# Patient Record
Sex: Female | Born: 1970 | Race: Black or African American | Hispanic: No | Marital: Single | Smoking: Never smoker
Health system: Southern US, Community
[De-identification: ages and names within clinical notes are randomized; demographics above are authoritative.]

## PROBLEM LIST (undated history)

## (undated) ENCOUNTER — Ambulatory Visit: Source: Home / Self Care | Attending: Family Medicine | Admitting: Family Medicine

## (undated) DIAGNOSIS — D649 Anemia, unspecified: Secondary | ICD-10-CM

## (undated) DIAGNOSIS — N920 Excessive and frequent menstruation with regular cycle: Secondary | ICD-10-CM

---

## 1999-09-27 HISTORY — PX: SALPINGECTOMY: SHX328

## 2001-09-26 HISTORY — PX: TUBAL LIGATION: SHX77

## 2002-01-09 ENCOUNTER — Ambulatory Visit (HOSPITAL_COMMUNITY): Admission: RE | Admit: 2002-01-09 | Discharge: 2002-01-09 | Payer: Self-pay | Admitting: *Deleted

## 2002-02-20 ENCOUNTER — Other Ambulatory Visit: Admission: RE | Admit: 2002-02-20 | Discharge: 2002-03-08 | Payer: Self-pay | Admitting: Obstetrics

## 2002-04-04 ENCOUNTER — Inpatient Hospital Stay (HOSPITAL_COMMUNITY): Admission: AD | Admit: 2002-04-04 | Discharge: 2002-04-04 | Payer: Self-pay | Admitting: *Deleted

## 2002-04-17 ENCOUNTER — Encounter (INDEPENDENT_AMBULATORY_CARE_PROVIDER_SITE_OTHER): Payer: Self-pay

## 2002-04-17 ENCOUNTER — Inpatient Hospital Stay (HOSPITAL_COMMUNITY): Admission: AD | Admit: 2002-04-17 | Discharge: 2002-04-19 | Payer: Self-pay | Admitting: Obstetrics and Gynecology

## 2006-03-02 ENCOUNTER — Emergency Department (HOSPITAL_COMMUNITY): Admission: EM | Admit: 2006-03-02 | Discharge: 2006-03-02 | Payer: Self-pay | Admitting: Emergency Medicine

## 2006-04-29 ENCOUNTER — Emergency Department (HOSPITAL_COMMUNITY): Admission: EM | Admit: 2006-04-29 | Discharge: 2006-04-29 | Payer: Self-pay | Admitting: Emergency Medicine

## 2006-10-30 ENCOUNTER — Emergency Department (HOSPITAL_COMMUNITY): Admission: EM | Admit: 2006-10-30 | Discharge: 2006-10-30 | Payer: Self-pay | Admitting: Emergency Medicine

## 2007-06-28 ENCOUNTER — Emergency Department (HOSPITAL_COMMUNITY): Admission: EM | Admit: 2007-06-28 | Discharge: 2007-06-29 | Payer: Self-pay | Admitting: *Deleted

## 2007-08-28 ENCOUNTER — Emergency Department (HOSPITAL_COMMUNITY): Admission: EM | Admit: 2007-08-28 | Discharge: 2007-08-28 | Payer: Self-pay | Admitting: Emergency Medicine

## 2008-02-03 ENCOUNTER — Emergency Department (HOSPITAL_COMMUNITY): Admission: EM | Admit: 2008-02-03 | Discharge: 2008-02-04 | Payer: Self-pay | Admitting: Emergency Medicine

## 2008-03-12 ENCOUNTER — Emergency Department (HOSPITAL_COMMUNITY): Admission: EM | Admit: 2008-03-12 | Discharge: 2008-03-12 | Payer: Self-pay | Admitting: Emergency Medicine

## 2008-07-23 ENCOUNTER — Emergency Department (HOSPITAL_COMMUNITY): Admission: EM | Admit: 2008-07-23 | Discharge: 2008-07-23 | Payer: Self-pay | Admitting: Emergency Medicine

## 2008-07-24 ENCOUNTER — Emergency Department (HOSPITAL_COMMUNITY): Admission: EM | Admit: 2008-07-24 | Discharge: 2008-07-24 | Payer: Self-pay | Admitting: Emergency Medicine

## 2009-09-25 ENCOUNTER — Emergency Department (HOSPITAL_COMMUNITY): Admission: EM | Admit: 2009-09-25 | Discharge: 2009-09-26 | Payer: Self-pay | Admitting: Emergency Medicine

## 2011-02-11 NOTE — Op Note (Signed)
[facility]  PatientALEAYA, LATONA Visit Number: [id number] MRN: [id number]          Service Type: OBS Location: 910A [id number] Attending Physician:  Amada Kingfisher. Dictated by:   Shearon Balo, M.D. Proc. Date: 04/18/02 Admit Date:  04/17/2002 Discharge Date: 04/19/2002                             Operative Report  PREOPERATIVE DIAGNOSES:       A 40 year old G6, P6 status post spontaneous vaginal delivery with undesired fertility.  POSTOPERATIVE DIAGNOSES:      A 40 year old G6, P6 status post spontaneous vaginal delivery with undesired fertility.  PROCEDURE:                    Bilateral tubal ligation with right distal salpingectomy.  SURGEON:                      Shearon Balo, M.D.  ANESTHESIA:                   General endotracheal.  COMPLICATIONS:                None.  SPECIMENS:                    Left segment of fallopian tube and right distal tube.  ESTIMATED BLOOD LOSS:         20 cc.  DISPOSITION:                  Recovery room, stable.  INDICATIONS FOR PROCEDURE:    The patient is a 40 year old G6, P6 status post spontaneous vaginal delivery who has undesired fertility.  The 30 day medicaid papers were obtained and found to be valid.  She had been counseled on the risks of bleeding, infection, injury to internal organs.  In addition, she was counseled on the risk of failure of 1 per 200 and increased risk of ectopic gestation.  In addition, she was counseled on the permanence of this procedure and alternative forms of contraception.  The patient states that she had a previous right salpingectomy from an ectopic pregnancy.  This procedure was done in [place], [place] and no operative note is immediately available.  PROCEDURE:                    The patient is taken to the operating room.  She is placed under general endotracheal anesthesia.  She is prepped and draped in a sterile fashion.  Incision made at the umbilicus  with a scalpel and carried down to underlying fascia with the Mayo scissors.  The fascia was entered sharply.  The patients left fallopian tube was identified and followed to the fimbriated end.  The tube was grasped approximately 3 cm from the cornual region and ligated with a tie of 0 plain gut.  A 1 cm segment of tube was excised.  The patients right fallopian tube was attempted to be found.  There were some filmy adhesions on that side and after lysis they we were able to visualize a portion of the patients right tube.  Although there were adhesions here, there was no definite partial salpingectomy.  Decision was made to perform a distal salpingectomy.  The fimbriae were densely adhesed to the ovary and dissected to be freed off the surface of the  ovary.  This was hemostatic.  A double loop of 0 plain gut was tied around the mid portion of the tube.  Approximately 2 cm segment of tube was excised with the fimbriae intact.  The tube was inspected and noted to be hemostatic.  The patients left tube was again inspected and noted to be hemostatic.  The fascia was then closed with a running stitch of 0 Vicryl.  The subcutaneous tissues were irrigated briskly and then injected with Marcaine for anesthesia.  The skin edges were reapproximated with a subcuticular stitch of 4-0 Monocryl.  The patient tolerated procedure well and returned to the recovery room in stable condition. Dictated by:   Shearon Balo, M.D. Attending Physician:  Amada Kingfisher. DD:  04/18/02 TD:  04/22/02 Job: [id number]

## 2011-07-07 LAB — URINE CULTURE: Culture: NO GROWTH

## 2011-07-07 LAB — URINE MICROSCOPIC-ADD ON

## 2011-07-07 LAB — URINALYSIS, ROUTINE W REFLEX MICROSCOPIC
Bilirubin Urine: NEGATIVE
Nitrite: NEGATIVE
Specific Gravity, Urine: 1.025
Urobilinogen, UA: 1
pH: 6.5

## 2011-07-07 LAB — PREGNANCY, URINE: Preg Test, Ur: NEGATIVE

## 2012-01-27 ENCOUNTER — Emergency Department (HOSPITAL_COMMUNITY): Payer: Self-pay

## 2012-01-27 ENCOUNTER — Emergency Department (HOSPITAL_COMMUNITY)
Admission: EM | Admit: 2012-01-27 | Discharge: 2012-01-27 | Disposition: A | Discharge status: Home / Self Care | Payer: Self-pay | Attending: Emergency Medicine | Admitting: Emergency Medicine

## 2012-01-27 ENCOUNTER — Encounter (HOSPITAL_COMMUNITY): Payer: Self-pay | Admitting: Anesthesiology

## 2012-01-27 ENCOUNTER — Emergency Department (HOSPITAL_COMMUNITY): Payer: Self-pay | Admitting: Anesthesiology

## 2012-01-27 ENCOUNTER — Encounter (HOSPITAL_COMMUNITY)
Admission: EM | Disposition: A | Discharge status: Home / Self Care | Payer: Self-pay | Source: Home / Self Care | Attending: Emergency Medicine

## 2012-01-27 ENCOUNTER — Encounter (HOSPITAL_COMMUNITY): Payer: Self-pay | Admitting: Emergency Medicine

## 2012-01-27 DIAGNOSIS — S01501A Unspecified open wound of lip, initial encounter: Secondary | ICD-10-CM | POA: Insufficient documentation

## 2012-01-27 DIAGNOSIS — IMO0002 Reserved for concepts with insufficient information to code with codable children: Secondary | ICD-10-CM | POA: Insufficient documentation

## 2012-01-27 DIAGNOSIS — Z23 Encounter for immunization: Secondary | ICD-10-CM | POA: Insufficient documentation

## 2012-01-27 DIAGNOSIS — S02402A Zygomatic fracture, unspecified, initial encounter for closed fracture: Secondary | ICD-10-CM

## 2012-01-27 DIAGNOSIS — R220 Localized swelling, mass and lump, head: Secondary | ICD-10-CM | POA: Insufficient documentation

## 2012-01-27 DIAGNOSIS — S0083XA Contusion of other part of head, initial encounter: Secondary | ICD-10-CM

## 2012-01-27 DIAGNOSIS — S02401A Maxillary fracture, unspecified, initial encounter for closed fracture: Secondary | ICD-10-CM | POA: Insufficient documentation

## 2012-01-27 DIAGNOSIS — S01511A Laceration without foreign body of lip, initial encounter: Secondary | ICD-10-CM

## 2012-01-27 DIAGNOSIS — R110 Nausea: Secondary | ICD-10-CM | POA: Insufficient documentation

## 2012-01-27 DIAGNOSIS — S0003XA Contusion of scalp, initial encounter: Secondary | ICD-10-CM | POA: Insufficient documentation

## 2012-01-27 DIAGNOSIS — S02400A Malar fracture unspecified, initial encounter for closed fracture: Secondary | ICD-10-CM | POA: Insufficient documentation

## 2012-01-27 DIAGNOSIS — Y92009 Unspecified place in unspecified non-institutional (private) residence as the place of occurrence of the external cause: Secondary | ICD-10-CM | POA: Insufficient documentation

## 2012-01-27 DIAGNOSIS — H1132 Conjunctival hemorrhage, left eye: Secondary | ICD-10-CM

## 2012-01-27 DIAGNOSIS — H1130 Conjunctival hemorrhage, unspecified eye: Secondary | ICD-10-CM | POA: Insufficient documentation

## 2012-01-27 DIAGNOSIS — R6884 Jaw pain: Secondary | ICD-10-CM | POA: Insufficient documentation

## 2012-01-27 HISTORY — PX: ORIF ZYGOMATIC FRACTURE: SHX2134

## 2012-01-27 LAB — POCT I-STAT, CHEM 8
BUN: 6 mg/dL (ref 6–23)
Creatinine, Ser: 0.8 mg/dL (ref 0.50–1.10)
Glucose, Bld: 128 mg/dL — ABNORMAL HIGH (ref 70–99)
Hemoglobin: 11.9 g/dL — ABNORMAL LOW (ref 12.0–15.0)
Potassium: 4.5 mEq/L (ref 3.5–5.1)
Sodium: 139 mEq/L (ref 135–145)

## 2012-01-27 SURGERY — OPEN REDUCTION INTERNAL FIXATION (ORIF) ZYGOMATIC FRACTURE
Anesthesia: General | Site: Face | Laterality: Right | Wound class: Clean Contaminated

## 2012-01-27 MED ORDER — MORPHINE SULFATE 4 MG/ML IJ SOLN: 4.0000 mg | INTRAMUSCULAR | Status: DC | PRN

## 2012-01-27 MED ORDER — ACETAMINOPHEN 10 MG/ML IV SOLN
INTRAVENOUS | Status: DC | PRN
  Administered 2012-01-27: 1000 mg via INTRAVENOUS

## 2012-01-27 MED ORDER — CEFAZOLIN SODIUM-DEXTROSE 2-3 GM-% IV SOLR
2.0000 g | Freq: Once | INTRAVENOUS | Status: AC
Start: 2012-01-27 — End: 2012-01-27
  Administered 2012-01-27: 2 g via INTRAVENOUS
  Filled 2012-01-27: qty 50

## 2012-01-27 MED ORDER — ACETAMINOPHEN 10 MG/ML IV SOLN
INTRAVENOUS | Status: AC
  Filled 2012-01-27: qty 100

## 2012-01-27 MED ORDER — PROMETHAZINE HCL 25 MG/ML IJ SOLN: 6.2500 mg | INTRAMUSCULAR | Status: DC | PRN

## 2012-01-27 MED ORDER — HYDROCODONE-ACETAMINOPHEN 5-325 MG PO TABS
1.0000 | ORAL_TABLET | Freq: Once | ORAL | Status: AC
  Administered 2012-01-27: 1 via ORAL

## 2012-01-27 MED ORDER — SODIUM CHLORIDE 0.9 % IV SOLN: INTRAVENOUS | Status: DC

## 2012-01-27 MED ORDER — LACTATED RINGERS IV SOLN
INTRAVENOUS | Status: DC | PRN
  Administered 2012-01-27: 16:00:00 via INTRAVENOUS

## 2012-01-27 MED ORDER — KETAMINE HCL 10 MG/ML IJ SOLN
INTRAMUSCULAR | Status: DC | PRN
  Administered 2012-01-27: 10 mg via INTRAVENOUS

## 2012-01-27 MED ORDER — SUCCINYLCHOLINE CHLORIDE 20 MG/ML IJ SOLN
INTRAMUSCULAR | Status: DC | PRN
  Administered 2012-01-27: 140 mg via INTRAVENOUS

## 2012-01-27 MED ORDER — HYDROCODONE-ACETAMINOPHEN 5-325 MG PO TABS
2.0000 | ORAL_TABLET | Freq: Once | ORAL | Status: AC
  Administered 2012-01-27: 2 via ORAL
  Filled 2012-01-27: qty 2

## 2012-01-27 MED ORDER — LIDOCAINE-EPINEPHRINE 1 %-1:100000 IJ SOLN
INTRAMUSCULAR | Status: AC
  Filled 2012-01-27: qty 1

## 2012-01-27 MED ORDER — LACTATED RINGERS IV SOLN: INTRAVENOUS | Status: DC

## 2012-01-27 MED ORDER — TETANUS-DIPHTH-ACELL PERTUSSIS 5-2.5-18.5 LF-MCG/0.5 IM SUSP
0.5000 mL | Freq: Once | INTRAMUSCULAR | Status: AC
  Administered 2012-01-27: 0.5 mL via INTRAMUSCULAR
  Filled 2012-01-27: qty 0.5

## 2012-01-27 MED ORDER — DEXAMETHASONE SODIUM PHOSPHATE 4 MG/ML IJ SOLN
INTRAMUSCULAR | Status: DC | PRN
  Administered 2012-01-27: 10 mg via INTRAVENOUS

## 2012-01-27 MED ORDER — ONDANSETRON HCL 4 MG/2ML IJ SOLN
INTRAMUSCULAR | Status: DC | PRN
  Administered 2012-01-27 (×2): 2 mg via INTRAVENOUS

## 2012-01-27 MED ORDER — LIDOCAINE HCL (CARDIAC) 20 MG/ML IV SOLN
INTRAVENOUS | Status: DC | PRN
  Administered 2012-01-27: 20 mg via INTRAVENOUS

## 2012-01-27 MED ORDER — FENTANYL CITRATE 0.05 MG/ML IJ SOLN
INTRAMUSCULAR | Status: DC | PRN
  Administered 2012-01-27: 50 ug via INTRAVENOUS
  Administered 2012-01-27 (×2): 100 ug via INTRAVENOUS

## 2012-01-27 MED ORDER — LIDOCAINE-EPINEPHRINE 1 %-1:100000 IJ SOLN
INTRAMUSCULAR | Status: DC | PRN
  Administered 2012-01-27: 2 mL

## 2012-01-27 MED ORDER — LACTATED RINGERS IV SOLN
INTRAVENOUS | Status: DC
  Administered 2012-01-27: 1000 mL via INTRAVENOUS

## 2012-01-27 MED ORDER — MIDAZOLAM HCL 5 MG/5ML IJ SOLN
INTRAMUSCULAR | Status: DC | PRN
  Administered 2012-01-27: 2 mg via INTRAVENOUS

## 2012-01-27 MED ORDER — CEFAZOLIN SODIUM 1-5 GM-% IV SOLN: 1.0000 g | Freq: Once | INTRAVENOUS | Status: DC

## 2012-01-27 MED ORDER — LIDOCAINE HCL 2 % IJ SOLN
20.0000 mL | Freq: Once | INTRAMUSCULAR | Status: DC
  Filled 2012-01-27: qty 1

## 2012-01-27 MED ORDER — PROPOFOL 10 MG/ML IV EMUL
INTRAVENOUS | Status: DC | PRN
  Administered 2012-01-27: 200 mg via INTRAVENOUS

## 2012-01-27 MED ORDER — FENTANYL CITRATE 0.05 MG/ML IJ SOLN: 25.0000 ug | INTRAMUSCULAR | Status: DC | PRN

## 2012-01-27 MED ORDER — ONDANSETRON HCL 4 MG/2ML IJ SOLN: 4.0000 mg | INTRAMUSCULAR | Status: DC | PRN

## 2012-01-27 SURGICAL SUPPLY — 15 items
CLOTH BEACON ORANGE TIMEOUT ST (SAFETY) ×3 IMPLANT
ELECT COATED BLADE 2.86 ST (ELECTRODE) ×2 IMPLANT
GLOVE ECLIPSE 7.5 STRL STRAW (GLOVE) ×2 IMPLANT
GOWN STRL NON-REIN LRG LVL3 (GOWN DISPOSABLE) ×2 IMPLANT
GOWN STRL REIN XL XLG (GOWN DISPOSABLE) ×2 IMPLANT
IV NS 1000ML (IV SOLUTION) ×3
IV NS 1000ML BAXH (IV SOLUTION) ×1 IMPLANT
KIT BASIN OR (CUSTOM PROCEDURE TRAY) ×2 IMPLANT
NDL HYPO 25X1 1.5 SAFETY (NEEDLE) IMPLANT
NEEDLE HYPO 25X1 1.5 SAFETY (NEEDLE) ×3 IMPLANT
PACK EENT SPLIT (PACKS) ×2 IMPLANT
SUT VIC AB 4-0 PS2 27 (SUTURE) ×2 IMPLANT
SYR BULB IRRIGATION 50ML (SYRINGE) ×2 IMPLANT
SYR CONTROL 10ML LL (SYRINGE) ×2 IMPLANT
TOWEL OR 17X26 10 PK STRL BLUE (TOWEL DISPOSABLE) ×4 IMPLANT
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
## 2012-01-27 NOTE — Brief Op Note (Signed)
01/27/2012  5:11 PM  PATIENT:  Renaee Munda  41 y.o. female  PRE-OPERATIVE DIAGNOSIS:  Displaced right zygomatic arch fracture, upper lip laceration  POST-OPERATIVE DIAGNOSIS:  Displaced right zygomatic arch fracture, upper lip laceration  PROCEDURE:  Procedure(s) (LRB): 1) OPEN REDUCTION OF DISPLACED RIGHT ZYGOMATIC FRACTURE (Right) 2) Intermediate repair of upper lip lacerations (total 3cm)  SURGEON:  Surgeon(s) and Role:    * Darletta Moll, MD - Primary  PHYSICIAN ASSISTANT:   ASSISTANTS: none   ANESTHESIA:   general  EBL:     BLOOD ADMINISTERED:none  DRAINS: none   LOCAL MEDICATIONS USED:  LIDOCAINE  and Amount: 2 ml  SPECIMEN:  No Specimen  DISPOSITION OF SPECIMEN:  N/A  COUNTS:  YES  TOURNIQUET:  * No tourniquets in log *  DICTATION: .Other Dictation: Dictation Number [id number]  PLAN OF CARE: Discharge to home after PACU  PATIENT DISPOSITION:  PACU - hemodynamically stable.   Delay start of Pharmacological VTE agent (>24hrs) due to surgical blood loss or risk of bleeding: not applicable

## 2012-01-27 NOTE — Discharge Instructions (Signed)
1) Avoid pushing on the right lateral and midface. 2) Tape the plastic shield to the right lateral face for 2 days. 3) Patient may shower and wash her hair.  Elyse Prevo Philomena Doheny, MD
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
## 2012-01-27 NOTE — Anesthesia Preprocedure Evaluation (Signed)
Anesthesia Evaluation  Patient identified by MRN, date of birth, ID band Patient awake    Reviewed: Allergy & Precautions, H&P , NPO status , Patient's Chart, lab work & pertinent test results  History of Anesthesia Complications Negative for: history of anesthetic complications  Airway  TM Distance: >3 FB Neck ROM: Full  Mouth opening: Limited Mouth Opening  Dental  (+) Teeth Intact and Dental Advisory Given   Pulmonary neg pulmonary ROS,  breath sounds clear to auscultation  Pulmonary exam normal       Cardiovascular negative cardio ROS  Rhythm:Regular Rate:Normal     Neuro/Psych negative neurological ROS  negative psych ROS   GI/Hepatic negative GI ROS, Neg liver ROS,   Endo/Other  negative endocrine ROS  Renal/GU negative Renal ROS  negative genitourinary   Musculoskeletal negative musculoskeletal ROS (+)   Abdominal   Peds negative pediatric ROS (+)  Hematology negative hematology ROS (+)   Anesthesia Other Findings   Reproductive/Obstetrics negative OB ROS                           Anesthesia Physical Anesthesia Plan  ASA: I  Anesthesia Plan: General   Post-op Pain Management:    Induction: Intravenous  Airway Management Planned: Oral ETT  Additional Equipment:   Intra-op Plan:   Post-operative Plan: Extubation in OR  Informed Consent: I have reviewed the patients History and Physical, chart, labs and discussed the procedure including the risks, benefits and alternatives for the proposed anesthesia with the patient or authorized representative who has indicated his/her understanding and acceptance.   Dental advisory given  Plan Discussed with: CRNA  Anesthesia Plan Comments:         Anesthesia Quick Evaluation

## 2012-01-27 NOTE — Transfer of Care (Signed)
Immediate Anesthesia Transfer of Care Note  Patient: Gabrielle Roberts  Procedure(s) Performed: Procedure(s) (LRB): OPEN REDUCTION INTERNAL FIXATION (ORIF) ZYGOMATIC FRACTURE (Right)  Patient Location: PACU  Anesthesia Type: General  Level of Consciousness: awake and alert   Airway & Oxygen Therapy: Patient Spontanous Breathing and Patient connected to face mask oxygen  Post-op Assessment: Report given to PACU RN and Post -op Vital signs reviewed and stable  Post vital signs: Reviewed and stable  Complications: No apparent anesthesia complications

## 2012-01-27 NOTE — Anesthesia Postprocedure Evaluation (Signed)
  Anesthesia Post Note  Patient: Gabrielle Roberts  Procedure(s) Performed: Procedure(s) (LRB): OPEN REDUCTION INTERNAL FIXATION (ORIF) ZYGOMATIC FRACTURE (Right)  Anesthesia type: General  Patient location: PACU  Post pain: Pain level controlled  Post assessment: Post-op Vital signs reviewed  Last Vitals:  Filed Vitals:   01/27/12 1800  BP: 154/87  Pulse: 65  Temp: 36.8 C  Resp: 18    Post vital signs: Reviewed  Level of consciousness: sedated  Complications: No apparent anesthesia complications
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
## 2012-01-31 ENCOUNTER — Encounter (HOSPITAL_COMMUNITY): Payer: Self-pay | Admitting: Otolaryngology

## 2014-02-02 ENCOUNTER — Emergency Department (HOSPITAL_COMMUNITY)
Admission: EM | Admit: 2014-02-02 | Discharge: 2014-02-02 | Disposition: A | Discharge status: Home / Self Care | Payer: BC Managed Care – PPO | Attending: Emergency Medicine | Admitting: Emergency Medicine

## 2014-02-02 ENCOUNTER — Encounter (HOSPITAL_COMMUNITY): Payer: Self-pay | Admitting: Emergency Medicine

## 2014-02-02 ENCOUNTER — Emergency Department (HOSPITAL_COMMUNITY): Payer: BC Managed Care – PPO

## 2014-02-02 DIAGNOSIS — X500XXA Overexertion from strenuous movement or load, initial encounter: Secondary | ICD-10-CM | POA: Insufficient documentation

## 2014-02-02 DIAGNOSIS — Y9389 Activity, other specified: Secondary | ICD-10-CM | POA: Insufficient documentation

## 2014-02-02 DIAGNOSIS — S82409A Unspecified fracture of shaft of unspecified fibula, initial encounter for closed fracture: Secondary | ICD-10-CM

## 2014-02-02 DIAGNOSIS — S82209A Unspecified fracture of shaft of unspecified tibia, initial encounter for closed fracture: Secondary | ICD-10-CM | POA: Insufficient documentation

## 2014-02-02 DIAGNOSIS — Y929 Unspecified place or not applicable: Secondary | ICD-10-CM | POA: Insufficient documentation

## 2014-02-02 MED ORDER — OXYCODONE-ACETAMINOPHEN 5-325 MG PO TABS
2.0000 | ORAL_TABLET | Freq: Once | ORAL | Status: AC
  Administered 2014-02-02: 2 via ORAL
  Filled 2014-02-02: qty 2

## 2014-02-02 MED ORDER — OXYCODONE-ACETAMINOPHEN 5-325 MG PO TABS: 2.0000 | ORAL_TABLET | Freq: Four times a day (QID) | ORAL | Status: DC | PRN

## 2014-02-02 NOTE — ED Provider Notes (Signed)
CSN: [id number]     Arrival date & time 02/02/14  0000 History   First MD Initiated Contact with Patient 02/02/14 0017     Chief Complaint  Patient presents with  . Ankle Pain     (Consider location/radiation/quality/duration/timing/severity/associated sxs/prior Treatment) HPI Comments: Patient slipped running down hill in wet grass felt/heard pop.  Now with swelling to medial ankle  Has not taken any medication prior to arrival Previous fracture of distal fibula on R   Patient is a 43 y.o. female presenting with ankle pain. The history is provided by the patient.  Ankle Pain Location:  Ankle Time since incident:  30 minutes Injury: yes   Mechanism of injury: fall   Fall:    Fall occurred:  Running   Impact surface:  Grass   Entrapped after fall: no   Ankle location:  R ankle Pain details:    Quality:  Aching   Radiates to:  Does not radiate   Severity:  Severe   Onset quality:  Sudden   Timing:  Constant   Progression:  Unchanged Chronicity:  New Dislocation: no   Foreign body present:  No foreign bodies Tetanus status:  Unknown Prior injury to area:  Yes Relieved by:  None tried Worsened by:  Nothing tried Ineffective treatments:  None tried Associated symptoms: swelling   Associated symptoms: no fever     History reviewed. No pertinent past medical history. Past Surgical History  Procedure Laterality Date  . Orif zygomatic fracture  01/27/2012    Procedure: OPEN REDUCTION INTERNAL FIXATION (ORIF) ZYGOMATIC FRACTURE;  Surgeon: Ascencion Dike, MD;  Location: [facility];  Service: ENT;  Laterality: Right;  repair left lip laceration   History reviewed. No pertinent family history. History  Substance Use Topics  . Smoking status: Never Smoker   . Smokeless tobacco: Not on file  . Alcohol Use: Yes   OB History   Grav Para Term Preterm Abortions TAB SAB Ect Mult Living                 Review of Systems  Constitutional: Negative for fever and chills.   Musculoskeletal: Positive for joint swelling.  Skin: Negative for rash and wound.  Neurological: Negative for numbness.  All other systems reviewed and are negative.     Allergies  Review of patient's allergies indicates no known allergies.  Home Medications   Prior to Admission medications   Not on File   LMP 01/19/2014 Physical Exam  Nursing note and vitals reviewed. Constitutional: She is oriented to person, place, and time. She appears well-developed and well-nourished. No distress.  HENT:  Head: Normocephalic.  Eyes: Pupils are equal, round, and reactive to light.  Neck: Normal range of motion.  Cardiovascular: Normal rate.   Pulmonary/Chest: Effort normal.  Musculoskeletal: She exhibits edema and tenderness.       Right ankle: She exhibits decreased range of motion and swelling. She exhibits no ecchymosis, no deformity and no laceration. Tenderness. Medial malleolus tenderness found.  Neurological: She is alert and oriented to person, place, and time.  Skin: Skin is warm and dry. No erythema.    ED Course  Procedures (including critical care time) Labs Review Labs Reviewed - No data to display  Imaging Review Dg Ankle Complete Right  02/02/2014   CLINICAL DATA:  Fall.  Twisted ankle.  Lateral right ankle pain.  EXAM: RIGHT ANKLE - COMPLETE 3+ VIEW  COMPARISON:  DG ANKLE COMPLETE*R* dated 09/26/2009  FINDINGS: Old distal fibula  fracture is present with a recurrent oblique fracture the distal fibular metaphysis. The fracture is nondisplaced. Heterotopic bone is present between the distal tibia and fibula. The ankle mortise is congruent. The talar dome appears intact. There is some bony overlap on the talar dome.  IMPRESSION: Acute oblique distal fibular metadiaphysis fracture, nondisplaced. Old healed distal fibula fracture.   Electronically Signed   By: Dereck Ligas M.D.   On: 02/02/2014 01:32     EKG Interpretation None      MDM  Patien tplaced in CAM walker  with crutches to follow up with her ortho at [place] on Monday Final diagnoses:  Fracture closed, fibula, shaft         Garald Balding, NP 02/02/14 0211

## 2014-02-02 NOTE — Discharge Instructions (Signed)
Wear the CAM walker at all times while up and about  You do not need to sleep in it. Call and make an appointment with [facility] ortho for further evaluation, to monitor healing

## 2014-02-02 NOTE — ED Provider Notes (Signed)
Medical screening examination/treatment/procedure(s) were conducted as a shared visit with non-physician practitioner(s) or resident and myself. I personally evaluated the patient during the encounter and agree with the findings and plan unless otherwise indicated.  I have personally reviewed any xrays and/ or EKG's with the provider and I agree with interpretation.  43 year old female with history of right fibular fracture presents with right ankle pain and swelling after slipping prior to arrival. Patient heard a crack and had pain and swelling afterwards. On exam patient has moderate tenderness and swelling right distal fibula as well as medial malleoli, pain with all range of motion of the ankle. X-ray reviewed showing oblique distal fibular fracture concern for mild widening of joint space. Plan for splints and followup with orthopedics. Pain medicines in ED.  Right distal fibular fracture, fall.   Mariea Clonts, MD 02/02/14 [phone]

## 2014-02-02 NOTE — ED Notes (Signed)
Patient is alert and oriented x3.  She is complaining of right ankle pain after a fall.   Patient denies any LOC from fall.  Notable swelling to the right medial ankle. Currently she rates her pain 9 of 10.

## 2014-07-10 ENCOUNTER — Emergency Department (HOSPITAL_COMMUNITY)
Admission: EM | Admit: 2014-07-10 | Discharge: 2014-07-10 | Disposition: A | Discharge status: Home / Self Care | Payer: BC Managed Care – PPO | Attending: Emergency Medicine | Admitting: Emergency Medicine

## 2014-07-10 ENCOUNTER — Emergency Department (HOSPITAL_COMMUNITY): Payer: BC Managed Care – PPO

## 2014-07-10 ENCOUNTER — Encounter (HOSPITAL_COMMUNITY): Payer: Self-pay | Admitting: Emergency Medicine

## 2014-07-10 DIAGNOSIS — Z9079 Acquired absence of other genital organ(s): Secondary | ICD-10-CM | POA: Insufficient documentation

## 2014-07-10 DIAGNOSIS — R1084 Generalized abdominal pain: Secondary | ICD-10-CM | POA: Diagnosis present

## 2014-07-10 DIAGNOSIS — N12 Tubulo-interstitial nephritis, not specified as acute or chronic: Secondary | ICD-10-CM | POA: Diagnosis not present

## 2014-07-10 DIAGNOSIS — R51 Headache: Secondary | ICD-10-CM | POA: Diagnosis not present

## 2014-07-10 LAB — CBC WITH DIFFERENTIAL/PLATELET
Basophils Absolute: 0 10*3/uL (ref 0.0–0.1)
Basophils Relative: 0 % (ref 0–1)
EOS ABS: 0 10*3/uL (ref 0.0–0.7)
EOS PCT: 0 % (ref 0–5)
HEMATOCRIT: 32 % — AB (ref 36.0–46.0)
HEMOGLOBIN: 9.9 g/dL — AB (ref 12.0–15.0)
LYMPHS ABS: 1.5 10*3/uL (ref 0.7–4.0)
Lymphocytes Relative: 16 % (ref 12–46)
MCH: 23.5 pg — AB (ref 26.0–34.0)
MCHC: 30.9 g/dL (ref 30.0–36.0)
MCV: 76 fL — AB (ref 78.0–100.0)
MONO ABS: 0.5 10*3/uL (ref 0.1–1.0)
MONOS PCT: 6 % (ref 3–12)
Neutro Abs: 7.6 10*3/uL (ref 1.7–7.7)
Neutrophils Relative %: 79 % — ABNORMAL HIGH (ref 43–77)
Platelets: 251 10*3/uL (ref 150–400)
RBC: 4.21 MIL/uL (ref 3.87–5.11)
RDW: 18 % — ABNORMAL HIGH (ref 11.5–15.5)
WBC: 9.6 10*3/uL (ref 4.0–10.5)

## 2014-07-10 LAB — COMPREHENSIVE METABOLIC PANEL
ALT: 7 U/L (ref 0–35)
ANION GAP: 12 (ref 5–15)
AST: 14 U/L (ref 0–37)
Albumin: 3.8 g/dL (ref 3.5–5.2)
Alkaline Phosphatase: 53 U/L (ref 39–117)
BUN: 10 mg/dL (ref 6–23)
CALCIUM: 9.5 mg/dL (ref 8.4–10.5)
CO2: 27 mEq/L (ref 19–32)
Chloride: 96 mEq/L (ref 96–112)
Creatinine, Ser: 0.95 mg/dL (ref 0.50–1.10)
GFR calc non Af Amer: 73 mL/min — ABNORMAL LOW (ref >90)
GFR, EST AFRICAN AMERICAN: 85 mL/min — AB (ref >90)
Glucose, Bld: 99 mg/dL (ref 70–99)
Potassium: 3.8 mEq/L (ref 3.7–5.3)
Sodium: 135 mEq/L — ABNORMAL LOW (ref 137–147)
TOTAL PROTEIN: 8.6 g/dL — AB (ref 6.0–8.3)
Total Bilirubin: 0.3 mg/dL (ref 0.3–1.2)

## 2014-07-10 LAB — LIPASE, BLOOD: Lipase: 25 U/L (ref 11–59)

## 2014-07-10 LAB — URINALYSIS, ROUTINE W REFLEX MICROSCOPIC
Bilirubin Urine: NEGATIVE
GLUCOSE, UA: NEGATIVE mg/dL
KETONES UR: 15 mg/dL — AB
Nitrite: NEGATIVE
PROTEIN: 100 mg/dL — AB
Specific Gravity, Urine: 1.023 (ref 1.005–1.030)
Urobilinogen, UA: 0.2 mg/dL (ref 0.0–1.0)
pH: 5.5 (ref 5.0–8.0)

## 2014-07-10 LAB — URINE MICROSCOPIC-ADD ON

## 2014-07-10 LAB — I-STAT BETA HCG BLOOD, ED (MC, WL, AP ONLY): I-stat hCG, quantitative: <5 m[IU]/mL (ref <5)

## 2014-07-10 LAB — I-STAT TROPONIN, ED: TROPONIN I, POC: 0.01 ng/mL (ref 0.00–0.08)

## 2014-07-10 MED ORDER — IOHEXOL 300 MG/ML  SOLN
100.0000 mL | Freq: Once | INTRAMUSCULAR | Status: AC | PRN
  Administered 2014-07-10: 100 mL via INTRAVENOUS

## 2014-07-10 MED ORDER — HYDROCODONE-ACETAMINOPHEN 5-325 MG PO TABS: 1.0000 | ORAL_TABLET | ORAL | Status: DC | PRN

## 2014-07-10 MED ORDER — IOHEXOL 300 MG/ML  SOLN: 25.0000 mL | INTRAMUSCULAR | Status: AC

## 2014-07-10 MED ORDER — DEXTROSE 5 % IV SOLN
1.0000 g | Freq: Once | INTRAVENOUS | Status: AC
  Administered 2014-07-10: 1 g via INTRAVENOUS
  Filled 2014-07-10: qty 10

## 2014-07-10 MED ORDER — CEPHALEXIN 500 MG PO CAPS: 500.0000 mg | ORAL_CAPSULE | Freq: Two times a day (BID) | ORAL | Status: DC

## 2014-07-10 MED ORDER — MORPHINE SULFATE 4 MG/ML IJ SOLN
4.0000 mg | Freq: Once | INTRAMUSCULAR | Status: AC
  Administered 2014-07-10: 4 mg via INTRAVENOUS
  Filled 2014-07-10: qty 1

## 2014-07-10 MED ORDER — IBUPROFEN 800 MG PO TABS: 800.0000 mg | ORAL_TABLET | Freq: Three times a day (TID) | ORAL | Status: DC | PRN

## 2014-07-10 MED ORDER — ONDANSETRON 4 MG PO TBDP: 4.0000 mg | ORAL_TABLET | Freq: Three times a day (TID) | ORAL | Status: DC | PRN

## 2014-07-10 MED ORDER — ONDANSETRON HCL 4 MG/2ML IJ SOLN
4.0000 mg | Freq: Once | INTRAMUSCULAR | Status: AC
  Administered 2014-07-10: 4 mg via INTRAVENOUS
  Filled 2014-07-10: qty 2

## 2014-07-10 MED ORDER — ACETAMINOPHEN 500 MG PO TABS
1000.0000 mg | ORAL_TABLET | Freq: Once | ORAL | Status: AC
  Administered 2014-07-10: 1000 mg via ORAL
  Filled 2014-07-10: qty 2

## 2014-07-10 MED ORDER — SODIUM CHLORIDE 0.9 % IV BOLUS (SEPSIS)
1000.0000 mL | Freq: Once | INTRAVENOUS | Status: AC
  Administered 2014-07-10: 1000 mL via INTRAVENOUS

## 2014-07-10 NOTE — ED Notes (Signed)
CT notified that patient finished with contrast. 

## 2014-07-10 NOTE — ED Notes (Signed)
Patient states headache for 2 days. Followed by abdominal pain for 2 days.   Patient complains of abdominal pain throughout with nausea but no vomiting.   Patient denies urinary symptoms.  Patient is currently menstruating.

## 2014-07-10 NOTE — ED Notes (Signed)
To ct

## 2014-07-10 NOTE — ED Provider Notes (Signed)
TIME SEEN: 11:30 AM  CHIEF COMPLAINT: Abdominal pain, chest pain, headache, fever  HPI: Patient is a 43 year old female with no significant past medical history who presents to the emergency department with complaints of 2 days of diffuse mild headache, diffuse sharp, achy abdominal pain that radiates into her chest, nausea. No neck pain or neck stiffness, sore throat or ear pain, cough, shortness of breath, vomiting or diarrhea, dysuria or hematuria, vaginal discharge. She is currently on her menstrual cycle. She reports her last episode of chest pain was last night. She denies any sick contacts or recent travel. She has had a prior ectopic surgery and had a salpingectomy she believes on the right side.  ROS: See HPI Constitutional:  fever  Eyes: no drainage  ENT: no runny nose   Cardiovascular:  chest pain  Resp: no SOB  GI: no vomiting GU: no dysuria Integumentary: no rash  Allergy: no hives  Musculoskeletal: no leg swelling  Neurological: no slurred speech ROS otherwise negative  PAST MEDICAL HISTORY/PAST SURGICAL HISTORY:  History reviewed. No pertinent past medical history.  MEDICATIONS:  Prior to Admission medications   Medication Sig Start Date End Date Taking? Authorizing Provider  oxyCODONE-acetaminophen (PERCOCET/ROXICET) 5-325 MG per tablet Take 2 tablets by mouth every 6 (six) hours as needed for severe pain. 02/02/14   Garald Balding, NP    ALLERGIES:  No Known Allergies  SOCIAL HISTORY:  History  Substance Use Topics  . Smoking status: Never Smoker   . Smokeless tobacco: Not on file  . Alcohol Use: Yes     Comment: 1 x weekly    FAMILY HISTORY: No family history on file.  EXAM: BP 136/91  Pulse 118  Temp(Src) 100.5 F (38.1 C) (Oral)  Resp 20  SpO2 100%  LMP 07/10/2014 CONSTITUTIONAL: Alert and oriented and responds appropriately to questions. Well-appearing; well-nourished, nontoxic, pleasant HEAD: Normocephalic EYES: Conjunctivae clear, PERRL ENT:  normal nose; no rhinorrhea; moist mucous membranes; pharynx without lesions noted NECK: Supple, no meningismus, no LAD  CARD: Regular and tachycardic; S1 and S2 appreciated; no murmurs, no clicks, no rubs, no gallops RESP: Normal chest excursion without splinting or tachypnea; breath sounds clear and equal bilaterally; no wheezes, no rhonchi, no rales, chest wall is nontender to palpation, there is no hypoxia or respiratory distress, no increased work of breathing, speaking full sentences ABD/GI: Normal bowel sounds; non-distended; soft, tender to palpation diffusely with mild voluntary guarding, no rebound, no peritoneal signs BACK:  The back appears normal and is non-tender to palpation, there is no CVA tenderness EXT: Normal ROM in all joints; non-tender to palpation; no edema; normal capillary refill; no cyanosis    SKIN: Normal color for age and race; warm NEURO: Moves all extremities equally PSYCH: The patient's mood and manner are appropriate. Grooming and personal hygiene are appropriate.  MEDICAL DECISION MAKING: Patient here with complaints of headache, chest pain, abdominal pain and fever. Differential diagnosis is large including pneumonia, ACS, cholecystitis, appendicitis, pancreatitis, UTI. Will obtain labs including troponin. We'll obtain an EKG. She has no risk factors for ACS or pulmonary embolus. We'll also obtain a chest x-ray to evaluate for infiltrate. Given her abdomen is diffusely tender to palpation, will obtain a CT of her abdomen and pelvis to evaluate for possible intra-abdominal pathology. We'll give IV fluids, pain and nausea medication.  ED PROGRESS: EKG shows nonspecific T wave changes in lateral leads but her last episode of chest pain was last night and her troponin is negative. She  has no risk factors for coronary artery disease. Her labs are unremarkable other than a hemoglobin of 9.9. She has no rectal bleeding or melena but does report heavy menstrual cycles. Chest  x-ray clear. Urine does show a urinary tract infection. Culture is pending. This may be the cause of patient's fever and abdominal pain. We'll start antibiotics. On reevaluation, patient is still diffusely tender to palpation therefore will proceed with a CT scan to rule out any other causes of her diffuse pain.   4:30 PM  Ct scan shows right-sided pyelonephritis. She also has a 2 mm nonobstructing left lower pole kidney stone. Patient's vital signs are within normal limits. Her heart rate is in the 80s. She is smiling, laughing, nontoxic appearing. Tolerating by mouth without difficulty. Patient has received ceftriaxone in the ED and a urine culture is pending. Discussed with patient that we could admit her for IV antibiotics for pyelonephritis but given she is a well-appearing, I feel she is safe to be discharged home on oral antibiotics and she is comfortable with this plan and would prefer discharge over admission. We'll discharge with Vicodin, ibuprofen, Zofran and Keflex. Discussed strict return precautions. Discussed supportive care instructions. She verbalized understanding and is comfortable with plan.    EKG Interpretation  Date/Time:  Thursday July 10 2014 11:38:59 EDT Ventricular Rate:  98 PR Interval:  156 QRS Duration: 78 QT Interval:  323 QTC Calculation: 412 R Axis:   4 Text Interpretation:  Age not entered, assumed to be  43 years old for purpose of ECG interpretation Sinus rhythm Nonspecific T abnormalities, lateral leads Confirmed by WARD,  DO, KRISTEN [phone]) on 07/10/2014 1:29:15 PM         [place], DO 07/10/14 1638

## 2014-07-10 NOTE — Discharge Instructions (Signed)
Pyelonephritis, Adult °Pyelonephritis is a kidney infection. In general, there are 2 main types of pyelonephritis: °· Infections that come on quickly without any warning (acute pyelonephritis). °· Infections that persist for a long period of time (chronic pyelonephritis). °CAUSES  °Two main causes of pyelonephritis are: °· Bacteria traveling from the bladder to the kidney. This is a problem especially in pregnant women. The urine in the bladder can become filled with bacteria from multiple causes, including: °¨ Inflammation of the prostate gland (prostatitis). °¨ Sexual intercourse in females. °¨ Bladder infection (cystitis). °· Bacteria traveling from the bloodstream to the tissue part of the kidney. °Problems that may increase your risk of getting a kidney infection include: °· Diabetes. °· Kidney stones or bladder stones. °· Cancer. °· Catheters placed in the bladder. °· Other abnormalities of the kidney or ureter. °SYMPTOMS  °· Abdominal pain. °· Pain in the side or flank area. °· Fever. °· Chills. °· Upset stomach. °· Blood in the urine (dark urine). °· Frequent urination. °· Strong or persistent urge to urinate. °· Burning or stinging when urinating. °DIAGNOSIS  °Your caregiver may diagnose your kidney infection based on your symptoms. A urine sample may also be taken. °TREATMENT  °In general, treatment depends on how severe the infection is.  °· If the infection is mild and caught early, your caregiver may treat you with oral antibiotics and send you home. °· If the infection is more severe, the bacteria may have gotten into the bloodstream. This will require intravenous (IV) antibiotics and a hospital stay. Symptoms may include: °¨ High fever. °¨ Severe flank pain. °¨ Shaking chills. °· Even after a hospital stay, your caregiver may require you to be on oral antibiotics for a period of time. °· Other treatments may be required depending upon the cause of the infection. °HOME CARE INSTRUCTIONS  °· Take your  antibiotics as directed. Finish them even if you start to feel better. °· Make an appointment to have your urine checked to make sure the infection is gone. °· Drink enough fluids to keep your urine clear or pale yellow. °· Take medicines for the bladder if you have urgency and frequency of urination as directed by your caregiver. °SEEK IMMEDIATE MEDICAL CARE IF:  °· You have a fever or persistent symptoms for more than 2-3 days. °· You have a fever and your symptoms suddenly get worse. °· You are unable to take your antibiotics or fluids. °· You develop shaking chills. °· You experience extreme weakness or fainting. °· There is no improvement after 2 days of treatment. °MAKE SURE YOU: °· Understand these instructions. °· Will watch your condition. °· Will get help right away if you are not doing well or get worse. °Document Released: 09/12/2005 Document Revised: 03/13/2012 Document Reviewed: 02/16/2011 °ExitCare® Patient Information ©2015 [employer]. This information is not intended to replace advice given to you by your health care provider. Make sure you discuss any questions you have with your health care provider. ° °

## 2014-07-11 LAB — URINE CULTURE

## 2015-04-01 ENCOUNTER — Emergency Department (HOSPITAL_COMMUNITY)
Admission: EM | Admit: 2015-04-01 | Discharge: 2015-04-01 | Disposition: A | Discharge status: Home / Self Care | Payer: 59 | Attending: Emergency Medicine | Admitting: Emergency Medicine

## 2015-04-01 ENCOUNTER — Encounter (HOSPITAL_COMMUNITY): Payer: Self-pay | Admitting: Emergency Medicine

## 2015-04-01 DIAGNOSIS — H9209 Otalgia, unspecified ear: Secondary | ICD-10-CM | POA: Diagnosis present

## 2015-04-01 DIAGNOSIS — Z79899 Other long term (current) drug therapy: Secondary | ICD-10-CM | POA: Insufficient documentation

## 2015-04-01 DIAGNOSIS — J029 Acute pharyngitis, unspecified: Secondary | ICD-10-CM | POA: Diagnosis not present

## 2015-04-01 LAB — RAPID STREP SCREEN (MED CTR MEBANE ONLY): Streptococcus, Group A Screen (Direct): NEGATIVE

## 2015-04-01 NOTE — ED Provider Notes (Signed)
CSN: [id number]     Arrival date & time 04/01/15  0919 History  This chart was scribed for non-physician practitioner, Hyman Bible, PA-C working with Veryl Speak, MD by Rayna Sexton, ED scribe. This patient was seen in room TR10C/TR10C and the patient's care was started at 10:43 AM.    Chief Complaint  Patient presents with  . Sore Throat  . Otalgia   The history is provided by the patient. No language interpreter was used.    HPI Comments: Gabrielle Roberts is a 44 y.o. female who presents to the Emergency Department complaining of constant, mild, left-sided throat and ear pain with onset this morning. Pt notes associated pain when swallowing and denies any history of strep throat. Pt denies taking any medications PTA. She denies fever, chills, nausea, vomiting, and cough.  No difficulty swallowing.    History reviewed. No pertinent past medical history. Past Surgical History  Procedure Laterality Date  . Orif zygomatic fracture  01/27/2012    Procedure: OPEN REDUCTION INTERNAL FIXATION (ORIF) ZYGOMATIC FRACTURE;  Surgeon: Ascencion Dike, MD;  Location: [facility];  Service: ENT;  Laterality: Right;  repair left lip laceration   No family history on file. History  Substance Use Topics  . Smoking status: Never Smoker   . Smokeless tobacco: Not on file  . Alcohol Use: Yes     Comment: 1 x weekly   OB History    No data available     Review of Systems  Constitutional: Negative for fever and chills.  HENT: Positive for ear pain and sore throat. Negative for trouble swallowing.   Respiratory: Negative for cough.   Gastrointestinal: Negative for nausea and vomiting.      Allergies  Review of patient's allergies indicates no known allergies.  Home Medications   Prior to Admission medications   Medication Sig Start Date End Date Taking? Authorizing Provider  cephALEXin (KEFLEX) 500 MG capsule Take 1 capsule (500 mg total) by mouth 2 (two) times daily. 07/10/14   Kristen N Ward, DO   HYDROcodone-acetaminophen (NORCO/VICODIN) 5-325 MG per tablet Take 1 tablet by mouth every 4 (four) hours as needed. 07/10/14   Kristen N Ward, DO  ibuprofen (ADVIL,MOTRIN) 200 MG tablet Take 400 mg by mouth daily as needed for mild pain.    Historical Provider, MD  ibuprofen (ADVIL,MOTRIN) 800 MG tablet Take 1 tablet (800 mg total) by mouth every 8 (eight) hours as needed for mild pain. 07/10/14   Kristen N Ward, DO  ondansetron (ZOFRAN ODT) 4 MG disintegrating tablet Take 1 tablet (4 mg total) by mouth every 8 (eight) hours as needed for nausea or vomiting. 07/10/14   Kristen N Ward, DO   BP 167/91 mmHg  Pulse 73  Temp(Src) 98.3 F (36.8 C) (Oral)  Resp 20  SpO2 98% Physical Exam  Constitutional: She is oriented to person, place, and time. She appears well-developed and well-nourished. No distress.  HENT:  Head: Normocephalic and atraumatic.  Right Ear: Tympanic membrane and external ear normal.  Left Ear: Tympanic membrane and external ear normal.  Mouth/Throat: Uvula is midline and oropharynx is clear and moist. No trismus in the jaw. No oropharyngeal exudate, posterior oropharyngeal edema or posterior oropharyngeal erythema.  Normal voice phonation No drooling.  Handling secretions well.    Eyes: Conjunctivae and EOM are normal. Pupils are equal, round, and reactive to light.  Neck: Normal range of motion. Neck supple. No tracheal deviation present.  Cardiovascular: Normal rate, regular rhythm, normal heart  sounds and intact distal pulses.  Exam reveals no gallop and no friction rub.   No murmur heard. Pulmonary/Chest: Effort normal. No respiratory distress. She has no wheezes. She has no rales.  Abdominal: Soft.  Musculoskeletal: Normal range of motion.  Neurological: She is alert and oriented to person, place, and time.  Skin: Skin is warm and dry.  Psychiatric: She has a normal mood and affect. Her behavior is normal.  Nursing note and vitals reviewed.   ED Course   Procedures  DIAGNOSTIC STUDIES: Oxygen Saturation is 98% on RA, normal by my interpretation.    COORDINATION OF CARE: 10:47 AM Discussed treatment plan with pt at bedside and pt agreed to plan.  Labs Review Labs Reviewed  RAPID STREP SCREEN (NOT AT [facility])    Imaging Review No results found.   EKG Interpretation None      MDM   Final diagnoses:  None  Patient presents today with a sore throat.  Rapid strep negative.  No signs of PTA or Retropharyngeal Abscess at this time.  Patient stable for discharge.  Return precautions given.  I personally performed the services described in this documentation, which was scribed in my presence. The recorded information has been reviewed and is accurate.    Hyman Bible, PA-C 04/01/15 1627  Veryl Speak, MD 04/02/15 [phone]

## 2015-04-01 NOTE — ED Notes (Signed)
Patient states sore throat (mostly on L side) and L ear pain since this morning.   Patient denies other symptoms.

## 2015-04-03 LAB — CULTURE, GROUP A STREP

## 2015-05-22 ENCOUNTER — Emergency Department (HOSPITAL_COMMUNITY)
Admission: EM | Admit: 2015-05-22 | Discharge: 2015-05-22 | Disposition: A | Discharge status: Home / Self Care | Payer: 59 | Attending: Emergency Medicine | Admitting: Emergency Medicine

## 2015-05-22 ENCOUNTER — Encounter (HOSPITAL_COMMUNITY): Payer: Self-pay | Admitting: Emergency Medicine

## 2015-05-22 ENCOUNTER — Emergency Department (HOSPITAL_COMMUNITY): Payer: 59

## 2015-05-22 DIAGNOSIS — M25512 Pain in left shoulder: Secondary | ICD-10-CM | POA: Diagnosis present

## 2015-05-22 MED ORDER — HYDROCODONE-ACETAMINOPHEN 5-325 MG PO TABS
1.0000 | ORAL_TABLET | Freq: Once | ORAL | Status: AC
  Administered 2015-05-22: 1 via ORAL
  Filled 2015-05-22: qty 1

## 2015-05-22 MED ORDER — IBUPROFEN 600 MG PO TABS: 600.0000 mg | ORAL_TABLET | Freq: Four times a day (QID) | ORAL | Status: DC | PRN

## 2015-05-22 MED ORDER — IBUPROFEN 200 MG PO TABS
600.0000 mg | ORAL_TABLET | Freq: Once | ORAL | Status: AC
  Administered 2015-05-22: 600 mg via ORAL
  Filled 2015-05-22: qty 3

## 2015-05-22 NOTE — ED Notes (Signed)
Pt c/o left shoulder pain that started on Wed.  Pt states that she can move her hand fine but when she goes to lift her arm up is when the pain gets severe in her left shoulder area.  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

## 2015-05-22 NOTE — ED Provider Notes (Signed)
CSN: [id number]     Arrival date & time 05/22/15  7342 History   First MD Initiated Contact with Patient 05/22/15 [phone]     Chief Complaint  Patient presents with  . Shoulder Pain     (Consider location/radiation/quality/duration/timing/severity/associated sxs/prior Treatment) HPI Comments: Pt comes in with cc of L sided shoulder pain. Pain started on Wednesday, got worse on Thursday, and today she is hardly able to move the shoulder. She has no medical hx. She denies any fevers, trauma, rash, hx of similar pain. No numbness, tingling.  Patient is a 44 y.o. female presenting with shoulder pain. The history is provided by the patient.  Shoulder Pain Associated symptoms: no neck pain     History reviewed. No pertinent past medical history. Past Surgical History  Procedure Laterality Date  . Orif zygomatic fracture  01/27/2012    Procedure: OPEN REDUCTION INTERNAL FIXATION (ORIF) ZYGOMATIC FRACTURE;  Surgeon: Ascencion Dike, MD;  Location: [facility];  Service: ENT;  Laterality: Right;  repair left lip laceration   No family history on file. Social History  Substance Use Topics  . Smoking status: Never Smoker   . Smokeless tobacco: None  . Alcohol Use: Yes     Comment: 1 x weekly   OB History    No data available     Review of Systems  Respiratory: Negative for shortness of breath.   Cardiovascular: Negative for chest pain.  Gastrointestinal: Negative for nausea, vomiting and abdominal pain.  Genitourinary: Negative for dysuria.  Musculoskeletal: Positive for arthralgias. Negative for neck pain.  Neurological: Negative for headaches.      Allergies  Review of patient's allergies indicates no known allergies.  Home Medications   Prior to Admission medications   Medication Sig Start Date End Date Taking? Authorizing Provider  acetaminophen (TYLENOL) 500 MG tablet Take 1,000 mg by mouth every 6 (six) hours as needed for mild pain, moderate pain or headache.   Yes Historical  Provider, MD  ibuprofen (ADVIL,MOTRIN) 600 MG tablet Take 1 tablet (600 mg total) by mouth every 6 (six) hours as needed. 05/22/15   Devanta Daniel Kathrynn Humble, MD   BP 157/93 mmHg  Pulse 94  Temp(Src) 98.5 F (36.9 C) (Oral)  Resp 17  Ht 5\' 4"  (1.626 m)  Wt 204 lb (92.534 kg)  BMI 35.00 kg/m2  SpO2 99%  LMP 05/22/2015 Physical Exam  Constitutional: She is oriented to person, place, and time. She appears well-developed.  HENT:  Head: Normocephalic and atraumatic.  Eyes: Conjunctivae and EOM are normal. Pupils are equal, round, and reactive to light.  Neck: Normal range of motion. Neck supple.  Cardiovascular: Normal rate, regular rhythm and normal heart sounds.   Pulmonary/Chest: Effort normal and breath sounds normal. No respiratory distress.  Musculoskeletal:  L shoulder tenderness over the [facility] joint. Pt's passive ROM is pain free - until the plane of the shoulder with abduction. Tenderness with external rotation of the shoulder, internal rotation was mildly tender. No joint stiffness.  Neurological: She is alert and oriented to person, place, and time.  Skin: Skin is warm and dry.  Nursing note and vitals reviewed.   ED Course  Procedures (including critical care time) Labs Review Labs Reviewed - No data to display  Imaging Review Dg Shoulder Left  05/22/2015   CLINICAL DATA:  Left shoulder pain, no known injury, history of repetitive motions at work, initial encounter  EXAM: LEFT SHOULDER - 2+ VIEW  COMPARISON:  None.  FINDINGS: There is no  evidence of fracture or dislocation. There is no evidence of arthropathy or other focal bone abnormality. Soft tissues are unremarkable.  IMPRESSION: No acute abnormality noted.   Electronically Signed   By: Inez Catalina M.D.   On: 05/22/2015 09:41   I have personally reviewed and evaluated these images and lab results as part of my medical decision-making.   EKG Interpretation None      MDM   Final diagnoses:  Acute shoulder pain, left     Pt comes in with cc of shoulder pain. On exam - she has [place]ips joint, AC joint tenderness. Also has tenderness with external rotation of the shoulder and with abduction. No precipitating event for this gradually worsening monoarticular pain. There is no clinical concern for septic joint - no significant swelling, warmth or redness seen.  Advised RICE. Gave sports med f/u.   Varney Biles, MD 05/22/15 1006

## 2015-05-22 NOTE — Discharge Instructions (Signed)
Please continue with RICE treatment. Please see the doctors if pain not getting better.  Elastic Bandage and RICE Elastic bandages come in different shapes and sizes. They perform different functions. Your caregiver will help you to decide what is best for your protection, recovery, or rehabilitation following an injury. The following are some general tips to help you use an elastic bandage.  Use the bandage as directed by the maker of the bandage you are using.  Do not wrap it too tight. This may cut off the circulation of the arm or leg below the bandage.  If part of your body beyond the bandage becomes blue, numb, or swollen, it is too tight. Loosen the bandage as needed to prevent these problems.  See your caregiver or trainer if the bandage seems to be making your problems worse rather than better. Bandages may be a reminder to you that you have an injury. However, they provide very little support. The few pounds of support they provide are minor considering the pressure it takes to injure a joint or tear ligaments. Therefore, the joint will not be able to handle all of the wear and tear it could before the injury. The routine care of many injuries includes Rest, Ice, Compression, and Elevation (RICE).  Rest is required to allow your body to heal. Generally, routine activities can be resumed when comfortable. Injured tendons and bones take about 6 weeks to heal.  Icing the injury helps keep the swelling down and reduces pain. Do not apply ice directly to the skin. Put ice in a plastic bag. Place a towel between the skin and the bag. This will prevent frostbite to the skin. Apply ice bags to the injured area for 15-20 minutes, every 2 hours while awake. Do this for the first 24 to 48 hours, then as directed by your caregiver.  Compression helps keep swelling down, gives support, and helps with discomfort. If an elastic bandage has been applied today, it should be removed and reapplied every 3  to 4 hours. It should not be applied tightly, but firmly enough to keep swelling down. Watch fingers or toes for swelling, bluish discoloration, coldness, numbness, or increased pain. If any of these problems occur, remove the bandage and reapply it more loosely. If these problems persist, contact your caregiver.  Elevation helps reduce swelling and decreases pain. The injured area (arms, hands, legs, or feet) should be placed near to or above the heart (center of the chest) if able. Persistent pain and inability to use the injured area for more than 2 to 3 days are warning signs. You should see a caregiver for a follow-up visit as soon as possible. Initially, a minor broken bone (hairline fracture) may not be seen on X-rays. It may take 7 to 10 days to finally show up. Continued pain and swelling show that further evaluation and/or X-rays are needed. Make a follow-up visit with your caregiver. A specialist in reading X-rays (radiologist) will read your X-rays again. Finding out the results of your test Not all test results are available during your visit. If your test results are not back during the visit, make an appointment with your caregiver to find out the results. Do not assume everything is normal if you have not heard from your caregiver or the medical facility. It is important for you to follow up on all of your test results. Document Released: 03/04/2002 Document Revised: 12/05/2011 Document Reviewed: 01/14/2008 [facility] Patient Information 2015 [place], [place]. This information is  not intended to replace advice given to you by your health care provider. Make sure you discuss any questions you have with your health care provider.    Shoulder Range of Motion Exercises The shoulder is the most flexible joint in the human body. Because of this it is also the most unstable joint in the body. All ages can develop shoulder problems. Early treatment of problems is necessary for a good outcome. People  react to shoulder pain by decreasing the movement of the joint. After a brief period of time, the shoulder can become "frozen". This is an almost complete loss of the ability to move the damaged shoulder. Following injuries your caregivers can give you instructions on exercises to keep your range of motion (ability to move your shoulder freely), or regain it if it has been lost.  [place]: Codman's Exercise or Pendulum Exercise  This exercise may be performed in a prone (face-down) lying position or standing while leaning on a chair with the opposite arm. Its purpose is to relax the muscles in your shoulder and slowly but surely increase the range of motion and to relieve pain.  Lie on your stomach close to the side edge of the bed. Let your weak arm hang over the edge of the bed. Relax your shoulder, arm and hand. Let your shoulder blade relax and drop down.  Slowly and gently swing your arm forward and back. Do not use your neck muscles; relax them. It might be easier to have someone else gently start swinging your arm.  As pain decreases, increase your swing. To start, arm swing should begin at 15 degree angles. In time and as pain lessens, move to 30-45 degree angles. Start with swinging for about 15 seconds, and work towards swinging for 3 to 5 minutes.  This exercise may also be performed in a standing/bent over position.  Stand and hold onto a sturdy chair with your good arm. Bend forward at the waist and bend your knees slightly to help protect your back. Relax your weak arm, let it hang limp. Relax your shoulder blade and let it drop.  Keep your shoulder relaxed and use body motion to swing your arm in small circles.  Stand up tall and relax.  Repeat motion and change direction of circles.  Start with swinging for about 30 seconds, and work towards swinging for 3 to 5 minutes. STRETCHING EXERCISES:  Lift your arm out in front of  you with the elbow bent at 90 degrees. Using your other arm gently pull the elbow forward and across your body.  Bend one arm behind you with the palm facing outward. Using the other arm, hold a towel or rope and reach this arm up above your head, then bend it at the elbow to move your wrist to behind your neck. Grab the free end of the towel with the hand behind your back. Gently pull the towel up with the hand behind your neck, gradually increasing the pull on the hand behind the small of your back. Then, gradually pull down with the hand behind the small of your back. This will pull the hand and arm behind your neck further. Both shoulders will have an increased range of motion with repetition of this exercise. STRENGTHENING EXERCISES:  Standing with your arm at your side and straight out from your shoulder with the elbow bent at 90 degrees, hold onto a small weight and slowly raise your hand  so it points straight up in the air. Repeat this five times to begin with, and gradually increase to ten times. Do this four times per day. As you grow stronger you can gradually increase the weight.  Repeat the above exercise, only this time using an elastic band. Start with your hand up in the air and pull down until your hand is by your side. As you grow stronger, gradually increase the amount you pull by increasing the number or size of the elastic bands. Use the same amount of repetitions.  Standing with your hand at your side and holding onto a weight, gradually lift the hand in front of you until it is over your head. Do the same also with the hand remaining at your side and lift the hand away from your body until it is again over your head. Repeat this five times to begin with, and gradually increase to ten times. Do this four times per day. As you grow stronger you can gradually increase the weight. Document Released: 06/11/2003 Document Revised: 09/17/2013 Document Reviewed: 09/12/2005 [facility] Patient  Information 2015 [place], [place]. This information is not intended to replace advice given to you by your health care provider. Make sure you discuss any questions you have with your health care provider.

## 2015-05-22 NOTE — ED Notes (Signed)
Patient transported to X-ray 

## 2016-04-29 ENCOUNTER — Encounter (HOSPITAL_COMMUNITY): Payer: Self-pay | Admitting: Emergency Medicine

## 2016-04-29 ENCOUNTER — Emergency Department (HOSPITAL_COMMUNITY)
Admission: EM | Admit: 2016-04-29 | Discharge: 2016-04-29 | Disposition: A | Discharge status: Home / Self Care | Payer: Managed Care, Other (non HMO) | Attending: Emergency Medicine | Admitting: Emergency Medicine

## 2016-04-29 DIAGNOSIS — M25531 Pain in right wrist: Secondary | ICD-10-CM | POA: Diagnosis present

## 2016-04-29 DIAGNOSIS — Z791 Long term (current) use of non-steroidal anti-inflammatories (NSAID): Secondary | ICD-10-CM | POA: Diagnosis not present

## 2016-04-29 DIAGNOSIS — Y929 Unspecified place or not applicable: Secondary | ICD-10-CM | POA: Insufficient documentation

## 2016-04-29 DIAGNOSIS — R202 Paresthesia of skin: Secondary | ICD-10-CM

## 2016-04-29 DIAGNOSIS — Y990 Civilian activity done for income or pay: Secondary | ICD-10-CM | POA: Insufficient documentation

## 2016-04-29 DIAGNOSIS — X500XXA Overexertion from strenuous movement or load, initial encounter: Secondary | ICD-10-CM | POA: Diagnosis not present

## 2016-04-29 DIAGNOSIS — G5601 Carpal tunnel syndrome, right upper limb: Secondary | ICD-10-CM | POA: Diagnosis not present

## 2016-04-29 DIAGNOSIS — Y9389 Activity, other specified: Secondary | ICD-10-CM | POA: Diagnosis not present

## 2016-04-29 DIAGNOSIS — X503XXA Overexertion from repetitive movements, initial encounter: Secondary | ICD-10-CM

## 2016-04-29 DIAGNOSIS — X58XXXA Exposure to other specified factors, initial encounter: Secondary | ICD-10-CM

## 2016-04-29 MED ORDER — MELOXICAM 15 MG PO TABS: 15.0000 mg | ORAL_TABLET | Freq: Every day | ORAL | 0 refills | Status: DC

## 2016-04-29 NOTE — Discharge Instructions (Signed)
Your symptoms are consistent with carpal tunnel syndrome, which can be due to repetitive movements such as those you do at work. Take mobic daily as directed to help with pain and symptoms. Use additional tylenol to help with pain relief. Alternate between ice and heat to the area to help with symptoms, using ice/heat pack for no more than 20 minutes per hour for each. Use the wrist splint at all times for 1 week, then at night thereafter. May consider using it at work if symptoms return when you stop using it constantly during the day in 1 week. Follow up with the hand specialist in 1-2 weeks for recheck of symptoms. Return to the ER for changes or worsening symptoms.

## 2016-04-29 NOTE — ED Triage Notes (Signed)
Pt reports R hand numbness and pain that began on Wednesday. Numbness worst in middle and ring finger. Worse when sleeping at night. No known injury. Does repetitive movements at work.

## 2016-04-29 NOTE — ED Provider Notes (Signed)
[place] DEPT Provider Note   CSN: [id number] Arrival date & time: 04/29/16  [id number]  First Provider Contact:  First MD Initiated Contact with Patient 04/29/16 0809        History   Chief Complaint Chief Complaint  Patient presents with  . Hand Problem    HPI Gabrielle Roberts is a 45 y.o. female who presents to the ED with complaints of right wrist pain 2 days. She describes the pain is 6/10 intermittent pulling type pain that radiates into the forearm, worse with movement of her hand and wrist, and improved with Aleve. Associated symptoms include paresthesias (numbness/tingling) to her right second third and fourth digits on the palmar aspect which she reports is intermittent and seems to be mostly related to position of her wrist/hand. She reports that last night she had to wake up in the middle the night to "shake it out", which resolved the paresthesias. She has also noticed some slight swelling in the wrist. She has never had any issues with this wrist before. 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 She is right-handed. Denies any injuries.  She denies any fevers, chills, chest pain, shortness breath, abdominal pain, nausea, vomiting, diarrhea, constipation, dysuria, hematuria, persistent numbness, or focal weakness. She denies any erythema or warmth to the wrist.   The history is provided by the patient. No language interpreter was used.    History reviewed. No pertinent past medical history.  There are no active problems to display for this patient.   Past Surgical History:  Procedure Laterality Date  . ORIF ZYGOMATIC FRACTURE  01/27/2012   Procedure: OPEN REDUCTION INTERNAL FIXATION (ORIF) ZYGOMATIC FRACTURE;  Surgeon: Ascencion Dike, MD;  Location: [facility];  Service: ENT;  Laterality: Right;  repair left lip laceration    OB History    No data available       Home Medications    Prior to  Admission medications   Medication Sig Start Date End Date Taking? Authorizing Provider  acetaminophen (TYLENOL) 500 MG tablet Take 1,000 mg by mouth every 6 (six) hours as needed for mild pain, moderate pain or headache.    Historical Provider, MD  ibuprofen (ADVIL,MOTRIN) 600 MG tablet Take 1 tablet (600 mg total) by mouth every 6 (six) hours as needed. 05/22/15   Varney Biles, MD    Family History History reviewed. No pertinent family history.  Social History Social History  Substance Use Topics  . Smoking status: Never Smoker  . Smokeless tobacco: Not on file  . Alcohol use Yes     Comment: 1 x weekly     Allergies   Review of patient's allergies indicates no known allergies.   Review of Systems Review of Systems  Constitutional: Negative for chills and fever.  Respiratory: Negative for shortness of breath.   Cardiovascular: Negative for chest pain.  Gastrointestinal: Negative for abdominal pain, constipation, diarrhea, nausea and vomiting.  Genitourinary: Negative for dysuria and hematuria.  Musculoskeletal: Positive for arthralgias and joint swelling. Negative for myalgias.  Skin: Negative for color change and wound.  Allergic/Immunologic: Negative for immunocompromised state.  Neurological: Positive for numbness (paresthesias to R hand). Negative for weakness.  Psychiatric/Behavioral: Negative for confusion.   10 Systems reviewed and are negative for acute change except as noted in the HPI.   Physical Exam Updated Vital Signs BP 138/100   Pulse 75   Temp 98.6 F (37  C) (Oral)   Resp 16   SpO2 96%   Physical Exam  Constitutional: She is oriented to person, place, and time. Vital signs are normal. She appears well-developed and well-nourished.  Non-toxic appearance. No distress.  Afebrile, nontoxic, NAD  HENT:  Head: Normocephalic and atraumatic.  Mouth/Throat: Mucous membranes are normal.  Eyes: Conjunctivae and EOM are normal. Right eye exhibits no  discharge. Left eye exhibits no discharge.  Neck: Normal range of motion. Neck supple.  Cardiovascular: Normal rate and intact distal pulses.   Pulmonary/Chest: Effort normal. No respiratory distress.  Abdominal: Normal appearance. She exhibits no distension.  Musculoskeletal: Normal range of motion.       Right wrist: She exhibits tenderness. She exhibits normal range of motion, no bony tenderness, no swelling, no effusion, no crepitus, no deformity and no laceration.       Arms: R wrist with FROM intact, mild TTP over the volar aspect of the wrist along the carpal tunnel region, no focal bony TTP, no swelling or effusion, no crepitus or deformity, skin intact without overlying changes, +Phalen's test, +Tinel's sign, neg reverse phalen's test. Strength and sensation grossly intact although pt feels some "different" sensation to the medial aspect of the distal tip of the 4th digit on the palmar aspect. Distal pulses intact. Soft compartments.   Neurological: She is alert and oriented to person, place, and time. She has normal strength. No sensory deficit.  Skin: Skin is warm, dry and intact. No rash noted.  Psychiatric: She has a normal mood and affect. Her behavior is normal.  Nursing note and vitals reviewed.    ED Treatments / Results  Labs (all labs ordered are listed, but only abnormal results are displayed) Labs Reviewed - No data to display  EKG  EKG Interpretation None       Radiology No results found.  Procedures Procedures (including critical care time)  Medications Ordered in ED Medications - No data to display   Initial Impression / Assessment and Plan / ED Course  I have reviewed the triage vital signs and the nursing notes.  Pertinent labs & imaging results that were available during my care of the patient were reviewed by me and considered in my medical decision making (see chart for details).  Clinical Course    45 y.o. female here with right wrist pain  and paresthesias along the median nerve distribution 2 days. Does a significant amount of repetitive movements at work. Right-handed. On exam, tenderness to the volar aspect of the wrist over the carpal tunnel region, positive Phalen's and Tinel's signs. Symptoms consistent with carpal tunnel syndrome. Doubt need for imaging of the wrist, no injury. NVI with soft compartments. Will place in wrist cock up splint x1 wk, then at nights thereafter. F/up with hand specialist in 1-2wks. RICE and heat use discussed. Will start on mobic, discussed tylenol use. I explained the diagnosis and have given explicit precautions to return to the ER including for any other new or worsening symptoms. The patient understands and accepts the medical plan as it's been dictated and I have answered their questions. Discharge instructions concerning home care and prescriptions have been given. The patient is STABLE and is discharged to home in good condition.   Final Clinical Impressions(s) / ED Diagnoses   Final diagnoses:  Right wrist pain  Carpal tunnel syndrome of right wrist  Injury due to repetitive movement, initial encounter  Paresthesias    New Prescriptions New Prescriptions   MELOXICAM (MOBIC) 15  MG TABLET    Take 1 tablet (15 mg total) by mouth daily. TAKE WITH MEALS     Dalores Weger Camprubi-Soms, PA-C 04/29/16 [place], DO 04/29/16 1135

## 2017-01-10 ENCOUNTER — Encounter (HOSPITAL_COMMUNITY): Payer: Self-pay | Admitting: Emergency Medicine

## 2017-01-10 ENCOUNTER — Emergency Department (HOSPITAL_COMMUNITY)
Admission: EM | Admit: 2017-01-10 | Discharge: 2017-01-10 | Disposition: A | Discharge status: Home / Self Care | Payer: 59 | Attending: Emergency Medicine | Admitting: Emergency Medicine

## 2017-01-10 ENCOUNTER — Emergency Department (HOSPITAL_COMMUNITY): Payer: 59

## 2017-01-10 DIAGNOSIS — Z79899 Other long term (current) drug therapy: Secondary | ICD-10-CM | POA: Insufficient documentation

## 2017-01-10 DIAGNOSIS — M25512 Pain in left shoulder: Secondary | ICD-10-CM

## 2017-01-10 MED ORDER — CYCLOBENZAPRINE HCL 10 MG PO TABS: 10.0000 mg | ORAL_TABLET | Freq: Two times a day (BID) | ORAL | 0 refills | Status: DC | PRN

## 2017-01-10 MED ORDER — MELOXICAM 7.5 MG PO TABS: 7.5000 mg | ORAL_TABLET | Freq: Every day | ORAL | 0 refills | Status: AC

## 2017-01-10 MED ORDER — HYDROCODONE-ACETAMINOPHEN 5-325 MG PO TABS
1.0000 | ORAL_TABLET | Freq: Once | ORAL | Status: AC
  Administered 2017-01-10: 1 via ORAL
  Filled 2017-01-10: qty 1

## 2017-01-10 NOTE — ED Triage Notes (Signed)
Pt c/o L shoulder pain since yesterday afternoon. Pt states she lifts boxes at work and has had a rotator cuff injury in the past. Pt presents to triage guarding L shoulder and unable to move arm due to pain. Pain radiates to L neck, pt states when she relaxes arm by her side, pain worsens. Ice and OTC meds attempted but no relief.

## 2017-01-10 NOTE — Discharge Instructions (Signed)
Since your established with [facility], please call and schedule an appointment with them. You may take meloxicam to help with inflammation instead of ibuprofen and Flexeril for muscle spasms. Please do not drive or operate heavy machinery after taking Flexeril.If symptoms worsen or if you develop new symptoms, please return to the emergency department for re-evaluation.

## 2017-01-10 NOTE — ED Provider Notes (Signed)
[facility] DEPT Provider Note   CSN: [id number] Arrival date & time: 01/10/17  2376  By signing my name below, I, Ethelle Lyon Long, attest that this documentation has been prepared under the direction and in the presence of Kelsei Defino A. Kashira Behunin, PA-C. Electronically Signed: Ethelle Lyon Long, Scribe. 01/10/2017. 1:19 PM.  History   Chief Complaint Chief Complaint  Patient presents with  . Shoulder Pain    L shoulder   The history is provided by the patient and medical records. No language interpreter was used.   HPI Comments:  Gabrielle Roberts is a 46 y.o. female with no pertinent PMHx, who presents to the Emergency Department complaining of waxing and waning, radiating, gradually worsening, 10/10 left shoulder pain with some swelling onset yesterday afternoon. She reports last year, she injured her left rotator cuff at work with no surgical intervention. Yesterday after work, similar pain from last year's injury arose which has gradually worsened since that time. She describes the pain as "hot, burning, and pulling" and states she lifts boxes at work which may have caused the pain today. Pt has an associated symptom of sharp, left sided neck pain radiating from her shoulder. She tried ice and Ibuprofen at home with no relief of her symptoms. Exertion and movement exacerbates her pain while resting the arm alleviates it. Pt denies fever, chills, elbow pain, back pain, abdominal pain, and any other complaints at this time.  Pt was seen at [facility] for prior injury.   History reviewed. No pertinent past medical history.  There are no active problems to display for this patient.  Past Surgical History:  Procedure Laterality Date  . ORIF ZYGOMATIC FRACTURE  01/27/2012   Procedure: OPEN REDUCTION INTERNAL FIXATION (ORIF) ZYGOMATIC FRACTURE;  Surgeon: Ascencion Dike, MD;  Location: [facility];  Service: ENT;  Laterality: Right;  repair left lip laceration   OB History    No data available     Home Medications    Prior to Admission medications   Medication Sig Start Date End Date Taking? Authorizing Provider  acetaminophen (TYLENOL) 500 MG tablet Take 1,000 mg by mouth every 6 (six) hours as needed for mild pain, moderate pain or headache.    Historical Provider, MD  cyclobenzaprine (FLEXERIL) 10 MG tablet Take 1 tablet (10 mg total) by mouth 2 (two) times daily as needed for muscle spasms. 01/10/17   Sharmane Dame A Ginia Rudell, PA-C  meloxicam (MOBIC) 7.5 MG tablet Take 1 tablet (7.5 mg total) by mouth daily. 01/10/17 01/20/17  Delaine Hernandez A Elliot Simoneaux, PA-C  naproxen sodium (ANAPROX) 220 MG tablet Take 220 mg by mouth 2 (two) times daily as needed (pain).    Historical Provider, MD   Family History History reviewed. No pertinent family history.  Social History Social History  Substance Use Topics  . Smoking status: Never Smoker  . Smokeless tobacco: Not on file  . Alcohol use Yes     Comment: 1 x weekly   Allergies   Patient has no known allergies.  Review of Systems Review of Systems  Constitutional: Negative for activity change, chills and fever.  Respiratory: Negative for shortness of breath.   Cardiovascular: Negative for chest pain.  Gastrointestinal: Negative for abdominal pain.  Musculoskeletal: Positive for arthralgias and neck pain. Negative for back pain.  Skin: Negative for rash.   Physical Exam Updated Vital Signs BP (!) 165/108   Pulse 89   Resp 16   LMP 12/19/2016 (Within Days)   SpO2 99%  Physical Exam  Constitutional: She appears well-developed and well-nourished. No distress.  HENT:  Head: Normocephalic and atraumatic.  Eyes: Conjunctivae are normal.  Neck: Neck supple.  Cardiovascular: Normal rate and regular rhythm.  Exam reveals no gallop and no friction rub.   No murmur heard. Bilateral Radial pulses 2+  Pulmonary/Chest: Effort normal and breath sounds normal. No respiratory distress. She has no wheezes. She has no rales.  Clear to auscultation  bilaterally.   Abdominal: Soft. She exhibits no distension. There is no tenderness. There is no guarding.  Musculoskeletal: She exhibits tenderness. She exhibits no edema.  Decreased active ROM with abduction and adduction. Full passive ROM with abduction with decreased adduction, flexion, and extension secondary to pain. TTP over the anterior left deltoid and over the posterior trapezius. No bony tenderness of the acromion, scapula, or humerus. Positive empty can test during which the pt became tearful.   Neurological: She is alert.  Skin: Skin is warm and dry. No rash noted. She is not diaphoretic.  Psychiatric: Her behavior is normal.  Nursing note and vitals reviewed.  ED Treatments / Results  DIAGNOSTIC STUDIES:  Oxygen Saturation is 100% on RA, normal by my interpretation.    COORDINATION OF CARE:  1:19 PM Discussed treatment plan with pt at bedside that includes XR of left shoulder with meloxicam and pt agreed to plan.  Labs (all labs ordered are listed, but only abnormal results are displayed) Labs Reviewed - No data to display  EKG  EKG Interpretation None       Radiology No results found.  Procedures Procedures (including critical care time)  Medications Ordered in ED Medications  HYDROcodone-acetaminophen (NORCO/VICODIN) 5-325 MG per tablet 1 tablet (1 tablet Oral Given 01/10/17 1344)     Initial Impression / Assessment and Plan / ED Course  I have reviewed the triage vital signs and the nursing notes.  Pertinent labs & imaging results that were available during my care of the patient were reviewed by me and considered in my medical decision making (see chart for details).     Patient X-Ray negative for obvious fracture or dislocation.  Given history and physical exam, likely rotator cuff pathology. Pt advised to follow up with orthopedics. Patient given sling for comfort while in ED, conservative therapy recommended and discussed. Patient will be discharged  home & is agreeable with above plan. Returns precautions discussed. Pt appears safe for discharge.  Final Clinical Impressions(s) / ED Diagnoses   Final diagnoses:  Acute pain of left shoulder    New Prescriptions Discharge Medication List as of 01/10/2017  1:33 PM    START taking these medications   Details  cyclobenzaprine (FLEXERIL) 10 MG tablet Take 1 tablet (10 mg total) by mouth 2 (two) times daily as needed for muscle spasms., Starting Tue 01/10/2017, Print        I personally performed the services described in this documentation, which was scribed in my presence. The recorded information has been reviewed and is accurate.     Joanne Gavel, PA-C 01/12/17 1626    Duffy Bruce, MD 01/14/17 1149

## 2017-10-17 ENCOUNTER — Encounter (HOSPITAL_COMMUNITY): Payer: Self-pay

## 2017-10-17 ENCOUNTER — Emergency Department (HOSPITAL_COMMUNITY)
Admission: EM | Admit: 2017-10-17 | Discharge: 2017-10-17 | Disposition: A | Discharge status: Home / Self Care | Payer: 59 | Attending: Emergency Medicine | Admitting: Emergency Medicine

## 2017-10-17 ENCOUNTER — Other Ambulatory Visit: Payer: Self-pay

## 2017-10-17 DIAGNOSIS — M62830 Muscle spasm of back: Secondary | ICD-10-CM

## 2017-10-17 DIAGNOSIS — M545 Low back pain: Secondary | ICD-10-CM | POA: Diagnosis present

## 2017-10-17 MED ORDER — METHOCARBAMOL 750 MG PO TABS: 750.0000 mg | ORAL_TABLET | Freq: Four times a day (QID) | ORAL | 0 refills | Status: DC

## 2017-10-17 MED ORDER — IBUPROFEN 600 MG PO TABS: 600.0000 mg | ORAL_TABLET | Freq: Four times a day (QID) | ORAL | 0 refills | Status: DC | PRN

## 2017-10-17 MED ORDER — HYDROCODONE-ACETAMINOPHEN 5-325 MG PO TABS: 2.0000 | ORAL_TABLET | ORAL | 0 refills | Status: DC | PRN

## 2017-10-17 NOTE — ED Triage Notes (Signed)
Patient c/o bilateral low back pain x 3 days. patient also reports that she has pain radiating down both legs as well. Patient states she had to bend a lot at work the day before her pain started.

## 2017-10-17 NOTE — ED Provider Notes (Signed)
[name] DEPT Provider Note   CSN: [id number] Arrival date & time: 10/17/17  0715     History   Chief Complaint Chief Complaint  Patient presents with  . Back Pain    HPI Gabrielle Roberts is a 47 y.o. female.  47 year old female presents with bilateral lower back pain times several days.  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  Some radiation to her legs but denies any bowel or bladder dysfunction.  Not dragging her foot when she walks.  No urinary symptoms.  Pain characterizes sharp and worse with movement and better with remaining still.  Unresponsive to over-the-counter medications.  Denies any prior history of back surgeries.      History reviewed. No pertinent past medical history.  There are no active problems to display for this patient.   Past Surgical History:  Procedure Laterality Date  . ORIF ZYGOMATIC FRACTURE  01/27/2012   Procedure: OPEN REDUCTION INTERNAL FIXATION (ORIF) ZYGOMATIC FRACTURE;  Surgeon: Ascencion Dike, MD;  Location: [facility];  Service: ENT;  Laterality: Right;  repair left lip laceration    OB History    No data available       Home Medications    Prior to Admission medications   Medication Sig Start Date End Date Taking? Authorizing Provider  acetaminophen (TYLENOL) 500 MG tablet Take 1,000 mg by mouth every 6 (six) hours as needed for mild pain, moderate pain or headache.    [provider]  cyclobenzaprine (FLEXERIL) 10 MG tablet Take 1 tablet (10 mg total) by mouth 2 (two) times daily as needed for muscle spasms. 01/10/17   McDonald, Mia A, PA-C  naproxen sodium (ANAPROX) 220 MG tablet Take 220 mg by mouth 2 (two) times daily as needed (pain).    [provider]    Family History Family History  Problem Relation Age of Onset  . Diabetes Mother   . Hypertension Mother     Social History Social History   Tobacco Use  . Smoking  status: Never Smoker  . Smokeless tobacco: Never Used  Substance Use Topics  . Alcohol use: Yes    Comment: 1 x weekly  . Drug use: No     Allergies   Patient has no known allergies.   Review of Systems Review of Systems  All other systems reviewed and are negative.    Physical Exam Updated Vital Signs BP (!) 155/86 (BP Location: Right Arm)   Pulse (!) 104   Temp 98 F (36.7 C) (Oral)   Resp 16   Ht 1.626 m (5\' 4" )   Wt 90.7 kg (200 lb)   LMP 09/17/2017   SpO2 100%   BMI 34.33 kg/m   Physical Exam  Constitutional: She is oriented to person, place, and time. She appears well-developed and well-nourished.  Non-toxic appearance. No distress.  HENT:  Head: Normocephalic and atraumatic.  Eyes: Conjunctivae, EOM and lids are normal. Pupils are equal, round, and reactive to light.  Neck: Normal range of motion. Neck supple. No tracheal deviation present. No thyroid mass present.  Cardiovascular: Normal rate, regular rhythm and normal heart sounds. Exam reveals no gallop.  No murmur heard. Pulmonary/Chest: Effort normal and breath sounds normal. No stridor. No respiratory distress. She has no decreased breath sounds. She has no wheezes. She has no rhonchi. She has no rales.  Abdominal: Soft. Normal appearance and bowel sounds are normal. She exhibits no  distension. There is no tenderness. There is no rebound and no CVA tenderness.  Musculoskeletal: Normal range of motion. She exhibits no edema or tenderness.       Back:  Neurological: She is alert and oriented to person, place, and time. She has normal strength. No cranial nerve deficit or sensory deficit. GCS eye subscore is 4. GCS verbal subscore is 5. GCS motor subscore is 6.  Reflex Scores:      Patellar reflexes are 3+ on the right side and 3+ on the left side. Skin: Skin is warm and dry. No abrasion and no rash noted.  Psychiatric: She has a normal mood and affect. Her speech is normal and behavior is normal.  Nursing  note and vitals reviewed.    ED Treatments / Results  Labs (all labs ordered are listed, but only abnormal results are displayed) Labs Reviewed - No data to display  EKG  EKG Interpretation None       Radiology No results found.  Procedures Procedures (including critical care time)  Medications Ordered in ED Medications - No data to display   Initial Impression / Assessment and Plan / ED Course  I have reviewed the triage vital signs and the nursing notes.  Pertinent labs & imaging results that were available during my care of the patient were reviewed by me and considered in my medical decision making (see chart for details).     Patient with likely muscular skeletal back pain.  Will prescribe analgesics and anti-inflammatories. Final Clinical Impressions(s) / ED Diagnoses   Final diagnoses:  None    ED Discharge Orders    None       Lacretia Leigh, MD 10/17/17 1115

## 2019-04-01 ENCOUNTER — Encounter (HOSPITAL_COMMUNITY): Payer: Self-pay | Admitting: Emergency Medicine

## 2019-04-01 ENCOUNTER — Emergency Department (HOSPITAL_COMMUNITY): Payer: Commercial Managed Care - PPO

## 2019-04-01 ENCOUNTER — Observation Stay (HOSPITAL_COMMUNITY)
Admission: EM | Admit: 2019-04-01 | Discharge: 2019-04-03 | Disposition: A | Discharge status: Home / Self Care | Payer: Commercial Managed Care - PPO | Attending: Internal Medicine | Admitting: Internal Medicine

## 2019-04-01 ENCOUNTER — Other Ambulatory Visit: Payer: Self-pay

## 2019-04-01 DIAGNOSIS — D473 Essential (hemorrhagic) thrombocythemia: Secondary | ICD-10-CM | POA: Diagnosis not present

## 2019-04-01 DIAGNOSIS — K922 Gastrointestinal hemorrhage, unspecified: Secondary | ICD-10-CM | POA: Diagnosis not present

## 2019-04-01 DIAGNOSIS — D259 Leiomyoma of uterus, unspecified: Secondary | ICD-10-CM | POA: Diagnosis not present

## 2019-04-01 DIAGNOSIS — K640 First degree hemorrhoids: Secondary | ICD-10-CM | POA: Insufficient documentation

## 2019-04-01 DIAGNOSIS — K625 Hemorrhage of anus and rectum: Principal | ICD-10-CM | POA: Insufficient documentation

## 2019-04-01 DIAGNOSIS — Z1159 Encounter for screening for other viral diseases: Secondary | ICD-10-CM | POA: Insufficient documentation

## 2019-04-01 DIAGNOSIS — D649 Anemia, unspecified: Secondary | ICD-10-CM | POA: Diagnosis present

## 2019-04-01 DIAGNOSIS — R030 Elevated blood-pressure reading, without diagnosis of hypertension: Secondary | ICD-10-CM | POA: Diagnosis present

## 2019-04-01 DIAGNOSIS — D500 Iron deficiency anemia secondary to blood loss (chronic): Secondary | ICD-10-CM | POA: Diagnosis not present

## 2019-04-01 DIAGNOSIS — D75839 Thrombocytosis, unspecified: Secondary | ICD-10-CM | POA: Diagnosis present

## 2019-04-01 HISTORY — DX: Anemia, unspecified: D64.9

## 2019-04-01 LAB — RETICULOCYTES
Immature Retic Fract: 32.7 % — ABNORMAL HIGH (ref 2.3–15.9)
RBC.: 2.42 MIL/uL — ABNORMAL LOW (ref 3.87–5.11)
Retic Count, Absolute: 14.4 10*3/uL — ABNORMAL LOW (ref 19.0–186.0)
Retic Ct Pct: 0.6 % (ref 0.4–3.1)

## 2019-04-01 LAB — PROTIME-INR
INR: 1.1 (ref 0.8–1.2)
Prothrombin Time: 14.4 seconds (ref 11.4–15.2)

## 2019-04-01 LAB — COMPREHENSIVE METABOLIC PANEL
ALT: 8 U/L (ref 0–44)
AST: 17 U/L (ref 15–41)
Albumin: 4.4 g/dL (ref 3.5–5.0)
Alkaline Phosphatase: 33 U/L — ABNORMAL LOW (ref 38–126)
Anion gap: 8 (ref 5–15)
BUN: 9 mg/dL (ref 6–20)
CO2: 25 mmol/L (ref 22–32)
Calcium: 9.3 mg/dL (ref 8.9–10.3)
Chloride: 107 mmol/L (ref 98–111)
Creatinine, Ser: 0.76 mg/dL (ref 0.44–1.00)
GFR calc Af Amer: >60 mL/min (ref >60)
GFR calc non Af Amer: >60 mL/min (ref >60)
Glucose, Bld: 93 mg/dL (ref 70–99)
Potassium: 3.6 mmol/L (ref 3.5–5.1)
Sodium: 140 mmol/L (ref 135–145)
Total Bilirubin: 0.2 mg/dL — ABNORMAL LOW (ref 0.3–1.2)
Total Protein: 8 g/dL (ref 6.5–8.1)

## 2019-04-01 LAB — MRSA PCR SCREENING: MRSA by PCR: NEGATIVE

## 2019-04-01 LAB — CBC
HCT: 15.6 % — ABNORMAL LOW (ref 36.0–46.0)
Hemoglobin: 3.7 g/dL — CL (ref 12.0–15.0)
MCH: 15.3 pg — ABNORMAL LOW (ref 26.0–34.0)
MCHC: 23.7 g/dL — ABNORMAL LOW (ref 30.0–36.0)
MCV: 64.5 fL — ABNORMAL LOW (ref 80.0–100.0)
Platelets: 1063 10*3/uL (ref 150–400)
RBC: 2.42 MIL/uL — ABNORMAL LOW (ref 3.87–5.11)
RDW: 36.2 % — ABNORMAL HIGH (ref 11.5–15.5)
WBC: 7.4 10*3/uL (ref 4.0–10.5)
nRBC: 0.5 % — ABNORMAL HIGH (ref 0.0–0.2)

## 2019-04-01 LAB — I-STAT BETA HCG BLOOD, ED (MC, WL, AP ONLY): I-stat hCG, quantitative: <5 m[IU]/mL (ref <5)

## 2019-04-01 LAB — PREPARE RBC (CROSSMATCH)

## 2019-04-01 LAB — IRON AND TIBC
Iron: 10 ug/dL — ABNORMAL LOW (ref 28–170)
Saturation Ratios: 2 % — ABNORMAL LOW (ref 10.4–31.8)
TIBC: 522 ug/dL — ABNORMAL HIGH (ref 250–450)
UIBC: 512 ug/dL

## 2019-04-01 LAB — FOLATE: Folate: 11.1 ng/mL (ref >5.9)

## 2019-04-01 LAB — SARS CORONAVIRUS 2 BY RT PCR (HOSPITAL ORDER, PERFORMED IN ~~LOC~~ HOSPITAL LAB): SARS Coronavirus 2: NEGATIVE

## 2019-04-01 LAB — ABO/RH: ABO/RH(D): O POS

## 2019-04-01 LAB — APTT: aPTT: 29 seconds (ref 24–36)

## 2019-04-01 LAB — FERRITIN: Ferritin: 2 ng/mL — ABNORMAL LOW (ref 11–307)

## 2019-04-01 LAB — POC OCCULT BLOOD, ED: Fecal Occult Bld: NEGATIVE

## 2019-04-01 LAB — VITAMIN B12: Vitamin B-12: 280 pg/mL (ref 180–914)

## 2019-04-01 MED ORDER — ONDANSETRON HCL 4 MG/2ML IJ SOLN: 4.0000 mg | Freq: Four times a day (QID) | INTRAMUSCULAR | Status: DC | PRN

## 2019-04-01 MED ORDER — SODIUM CHLORIDE 0.9 % IV BOLUS: 1000.0000 mL | Freq: Once | INTRAVENOUS | Status: DC

## 2019-04-01 MED ORDER — ONDANSETRON HCL 4 MG PO TABS: 4.0000 mg | ORAL_TABLET | Freq: Four times a day (QID) | ORAL | Status: DC | PRN

## 2019-04-01 MED ORDER — CHLORHEXIDINE GLUCONATE CLOTH 2 % EX PADS
6.0000 | MEDICATED_PAD | Freq: Every day | CUTANEOUS | Status: DC
  Administered 2019-04-02: 6 via TOPICAL

## 2019-04-01 MED ORDER — IOHEXOL 300 MG/ML  SOLN
100.0000 mL | Freq: Once | INTRAMUSCULAR | Status: AC | PRN
  Administered 2019-04-01: 18:00:00 100 mL via INTRAVENOUS

## 2019-04-01 MED ORDER — ACETAMINOPHEN 325 MG PO TABS: 650.0000 mg | ORAL_TABLET | Freq: Four times a day (QID) | ORAL | Status: DC | PRN

## 2019-04-01 MED ORDER — SODIUM CHLORIDE 0.9% IV SOLUTION
Freq: Once | INTRAVENOUS | Status: AC
  Administered 2019-04-01: 22:00:00 via INTRAVENOUS

## 2019-04-01 MED ORDER — HYDROCODONE-ACETAMINOPHEN 5-325 MG PO TABS: 1.0000 | ORAL_TABLET | ORAL | Status: DC | PRN

## 2019-04-01 MED ORDER — SODIUM CHLORIDE (PF) 0.9 % IJ SOLN
INTRAMUSCULAR | Status: AC
  Filled 2019-04-01: qty 50

## 2019-04-01 MED ORDER — ONDANSETRON HCL 4 MG/2ML IJ SOLN
4.0000 mg | Freq: Once | INTRAMUSCULAR | Status: AC
  Administered 2019-04-01: 17:00:00 4 mg via INTRAVENOUS
  Filled 2019-04-01: qty 2

## 2019-04-01 MED ORDER — SODIUM CHLORIDE 0.9% IV SOLUTION: Freq: Once | INTRAVENOUS | Status: DC

## 2019-04-01 MED ORDER — ACETAMINOPHEN 650 MG RE SUPP: 650.0000 mg | Freq: Four times a day (QID) | RECTAL | Status: DC | PRN

## 2019-04-01 MED ORDER — SODIUM CHLORIDE 0.9 % IV BOLUS
500.0000 mL | Freq: Once | INTRAVENOUS | Status: AC
  Administered 2019-04-01: 500 mL via INTRAVENOUS

## 2019-04-01 NOTE — ED Provider Notes (Signed)
[facility] DEPT Provider Note   CSN: [id number] Arrival date & time: 04/01/19  1235    History   Chief Complaint Chief Complaint  Patient presents with  . Abdominal Pain  . Rectal Bleeding    HPI Gabrielle Roberts is a 48 y.o. female.     HPI   Pt is a 48 y/o female with a h/o anemia who presents to the ED today for eval of rectal bleeding. States she had one episode of bright red blood per rectum today. Blood was on toilet tissue and in commode. She has no h/o same and has had no further episodes. Denies any melena. She does note that she had abd pain prior to the episode. Pain located to the LLQ of the abdomen. Initially felt like cramping. Now pain has improved and is rated at mild. She further states that she has had NV for the last 3-4 days. No hematemesis.  Denies diarrhea, constipation, dysuria, or fevers.  States she has had DOE for the last month. Denies cough, chest pain, or shortness of breath at rest. Denies leg pain, hemoptysis, recent surgery/trauma, recent long travel, hormone use, personal hx of cancer, or hx of DVT/PE.   States she drinks ETOH 1-2 times per month. Denies heavy NSAID use. Has never had colonoscopy and has never had similar sxs.   Past Medical History:  Diagnosis Date  . Anemia     Patient Active Problem List   Diagnosis Date Noted  . Lower GI bleed 04/01/2019  . Symptomatic anemia 04/01/2019  . Elevated blood pressure reading 04/01/2019  . Thrombocytosis ([place]) 04/01/2019    Past Surgical History:  Procedure Laterality Date  . ORIF ZYGOMATIC FRACTURE  01/27/2012   Procedure: OPEN REDUCTION INTERNAL FIXATION (ORIF) ZYGOMATIC FRACTURE;  Surgeon: Ascencion Dike, MD;  Location: [facility];  Service: ENT;  Laterality: Right;  repair left lip laceration     OB History   No obstetric history on file.      Home Medications    Prior to Admission medications   Medication Sig Start Date End Date Taking? Authorizing  Provider  cyclobenzaprine (FLEXERIL) 10 MG tablet Take 1 tablet (10 mg total) by mouth 2 (two) times daily as needed for muscle spasms. Patient not taking: Reported on 04/01/2019 01/10/17   McDonald, Maree Erie A, PA-C  HYDROcodone-acetaminophen (NORCO/VICODIN) 5-325 MG tablet Take 2 tablets by mouth every 4 (four) hours as needed. Patient not taking: Reported on 04/01/2019 10/17/17   Lacretia Leigh, MD  ibuprofen (ADVIL,MOTRIN) 600 MG tablet Take 1 tablet (600 mg total) by mouth every 6 (six) hours as needed. Patient not taking: Reported on 04/01/2019 10/17/17   Lacretia Leigh, MD  methocarbamol (ROBAXIN-750) 750 MG tablet Take 1 tablet (750 mg total) by mouth 4 (four) times daily. Patient not taking: Reported on 04/01/2019 10/17/17   Lacretia Leigh, MD    Family History Family History  Problem Relation Age of Onset  . Diabetes Mother   . Hypertension Mother     Social History Social History   Tobacco Use  . Smoking status: Never Smoker  . Smokeless tobacco: Never Used  Substance Use Topics  . Alcohol use: Yes    Comment: 1 x weekly  . Drug use: No     Allergies   Patient has no known allergies.   Review of Systems Review of Systems  Constitutional: Negative for fever.  HENT: Negative for ear pain and sore throat.   Eyes: Negative for visual  disturbance.  Respiratory: Positive for shortness of breath (exertional). Negative for cough.   Cardiovascular: Negative for chest pain and leg swelling.  Gastrointestinal: Positive for abdominal pain, blood in stool, nausea and vomiting. Negative for constipation and diarrhea.  Genitourinary: Negative for dysuria, hematuria and urgency.  Musculoskeletal: Negative for back pain.  Skin: Negative for rash.  Neurological: Negative for headaches.  All other systems reviewed and are negative.  Physical Exam Updated Vital Signs BP 118/79   Pulse 88   Temp 98.7 F (37.1 C) (Oral)   Resp 18   Ht 5\' 4"  (1.626 m)   Wt 83.9 kg   LMP 03/25/2019    SpO2 100%   BMI 31.76 kg/m   Physical Exam Vitals signs and nursing note reviewed.  Constitutional:      General: She is not in acute distress.    Appearance: She is well-developed. She is not ill-appearing or toxic-appearing.  HENT:     Head: Normocephalic and atraumatic.     Mouth/Throat:     Mouth: Mucous membranes are dry.  Eyes:     Conjunctiva/sclera: Conjunctivae normal.     Comments: Pale conjunctiva  Neck:     Musculoskeletal: Neck supple.  Cardiovascular:     Rate and Rhythm: Regular rhythm. Tachycardia present.     Heart sounds: Normal heart sounds. No murmur.  Pulmonary:     Effort: Pulmonary effort is normal. No respiratory distress.     Breath sounds: Normal breath sounds. No wheezing, rhonchi or rales.  Abdominal:     General: Bowel sounds are normal.     Palpations: Abdomen is soft.     Tenderness: There is abdominal tenderness in the left lower quadrant. There is no guarding or rebound.  Genitourinary:    Comments: Chaperone present during DRE. Multiple nonthrombosed external hemorrhoids present without evidence of active bleeding. No melena or bright red blood noted in the rectal vault. Musculoskeletal:        General: No swelling or tenderness.     Right lower leg: No edema.     Left lower leg: No edema.  Skin:    General: Skin is warm and dry.  Neurological:     Mental Status: She is alert.      ED Treatments / Results  Labs (all labs ordered are listed, but only abnormal results are displayed) Labs Reviewed  COMPREHENSIVE METABOLIC PANEL - Abnormal; Notable for the following components:      Result Value   Alkaline Phosphatase 33 (*)    Total Bilirubin 0.2 (*)    All other components within normal limits  CBC - Abnormal; Notable for the following components:   RBC 2.42 (*)    Hemoglobin 3.7 (*)    HCT 15.6 (*)    MCV 64.5 (*)    MCH 15.3 (*)    MCHC 23.7 (*)    RDW 36.2 (*)    Platelets 1,063 (*)    nRBC 0.5 (*)    All other components  within normal limits  IRON AND TIBC - Abnormal; Notable for the following components:   Iron 10 (*)    TIBC 522 (*)    Saturation Ratios 2 (*)    All other components within normal limits  FERRITIN - Abnormal; Notable for the following components:   Ferritin 2 (*)    All other components within normal limits  RETICULOCYTES - Abnormal; Notable for the following components:   RBC. 2.42 (*)    Retic Count, Absolute 14.4 (*)  Immature Retic Fract 32.7 (*)    All other components within normal limits  SARS CORONAVIRUS 2 (HOSPITAL ORDER, [facility])  PROTIME-INR  APTT  VITAMIN B12  FOLATE  POC OCCULT BLOOD, ED  I-STAT BETA HCG BLOOD, ED ([facility], [facility], [facility] ONLY)  TYPE AND SCREEN  ABO/RH  PREPARE RBC (CROSSMATCH)    EKG None  Radiology Ct Abdomen Pelvis W Contrast  Result Date: 04/01/2019 CLINICAL DATA:  Abdominal pain with nausea and vomiting. Rectal bleeding EXAM: CT ABDOMEN AND PELVIS WITH CONTRAST TECHNIQUE: Multidetector CT imaging of the abdomen and pelvis was performed using the standard protocol following bolus administration of intravenous contrast. CONTRAST:  153mL OMNIPAQUE IOHEXOL 300 MG/ML  SOLN COMPARISON:  July 10, 2014 FINDINGS: Lower chest: There is stable scarring in the lateral left base. There is no lung base edema or consolidation. Hepatobiliary: There are innumerable cysts throughout the liver, also present on previous study. There is a Riedel's lobe on the right, an anatomic variant. Pancreas: There is no evident pancreatic mass or inflammatory focus. Spleen: There is an apparent cyst near the dome of the spleen measuring 6 mm. No other splenic lesions evident. Adrenals/Urinary Tract: Adrenals bilaterally appear normal. Kidneys bilaterally show no evident mass or hydronephrosis on either side. Suspect prominent column of Bertin on the right, an anatomic variant. There is a 2 mm calculus in the lower pole of the right kidney. There is no evident  ureteral calculus on either side. Urinary bladder is midline with wall thickness within normal limits. Stomach/Bowel: There is no appreciable bowel wall or mesenteric thickening. No evident bowel obstruction. Terminal ileum appears unremarkable. There is no free air or portal venous air. Vascular/Lymphatic: There is no abdominal aortic aneurysm. No vascular lesions are demonstrable on this study. There is no appreciable adenopathy in the abdomen or pelvis. Reproductive: The uterus is anteverted. The uterus is prominent and somewhat lobular in contour, likely indicative of leiomyomatous change. There is a 3.0 x 2.8 cm likely dominant follicle right ovary. Other: Appendix appears normal. No evident abscess or ascites in the abdomen or pelvis. Musculoskeletal: No blastic or lytic bone lesions. No intramuscular or abdominal wall lesions are evident. IMPRESSION: 1. 2 mm nonobstructing calculus lower pole left kidney. No hydronephrosis or ureteral calculus on either side. Urinary bladder wall thickness normal. Note that the areas of inhomogeneous enhancement in the kidneys noted on previous study are no longer evident. Enhancement is symmetric bilaterally currently. 2. No bowel obstruction. No abscess in the abdomen or pelvis. Appendix appears normal. 3. Apparent leiomyomatous uterus. Probable dominant follicle right ovary. 4.  Innumerable apparent liver cysts, a stable finding. Electronically Signed   By: Lowella Grip III M.D.   On: 04/01/2019 17:52    Procedures Procedures (including critical care time) CRITICAL CARE Performed by: Rodney Booze   Total critical care time: 39 minutes  Critical care time was exclusive of separately billable procedures and treating other patients.  Critical care was necessary to treat or prevent imminent or life-threatening deterioration.  Critical care was time spent personally by me on the following activities: development of treatment plan with patient and/or  surrogate as well as nursing, discussions with consultants, evaluation of patient's response to treatment, examination of patient, obtaining history from patient or surrogate, ordering and performing treatments and interventions, ordering and review of laboratory studies, ordering and review of radiographic studies, pulse oximetry and re-evaluation of patient's condition.   Medications Ordered in ED Medications  0.9 %  sodium chloride infusion (Manually program via Guardrails IV Fluids) (has no administration in time range)  sodium chloride (PF) 0.9 % injection (has no administration in time range)  ondansetron (ZOFRAN) injection 4 mg (4 mg Intravenous Given 04/01/19 1713)  sodium chloride 0.9 % bolus 500 mL (500 mLs Intravenous New Bag/Given 04/01/19 1549)  iohexol (OMNIPAQUE) 300 MG/ML solution 100 mL (100 mLs Intravenous Contrast Given 04/01/19 1735)     Initial Impression / Assessment and Plan / ED Course  I have reviewed the triage vital signs and the nursing notes.  Pertinent labs & imaging results that were available during my care of the patient were reviewed by me and considered in my medical decision making (see chart for details).  Final Clinical Impressions(s) / ED Diagnoses   Final diagnoses:  Symptomatic anemia  Rectal bleeding   48 year old female presenting for evaluation of rectal bleeding.  One episode of bright red blood earlier today.  Also has had nausea and vomiting and some abdominal pain.  Also reports dyspnea on exertion for the last month.  No cough or chest pain.  No fevers.  Patient initially tachycardic.  No hypotension.  Afebrile.  On exam has mild left lower quadrant abdominal tenderness without rebound rigidity or guarding.  Cardiopulmonary exam benign.  Rectal exam performed with external hemorrhoids present.  No blood noted on rectal exam.  CBC significant for anemia at 3.7 and thrombocytopenia with platelets at 1063. CMP with normal electrolytes.  Normal  kidney and liver function. Hemoccult is negative. Coags are normal. Reticulocyte count elevated. Vitamin B-12 and folate are normal. Iron is low and TIBC is elevated.  CT abdomen pelvis is nonacute.  Patient informed the results.  Patient with profound anemia. Suspect there is a chronic component to this. She was given 2 units PRBC in the ED. Given small IVF bolus in setting of NV. Will require admission for further tx.   CONSULT With Dr. Harlin Heys with hospitalist service who accepts pt for admission. She recommends GI consultation.   CONSULT with Dr. Paulita Fujita with gastroenterology who will consult on the pt.   ED Discharge Orders    None       Bishop Dublin 04/01/19 1951    Valarie Merino, MD 04/01/19 2052

## 2019-04-01 NOTE — ED Triage Notes (Signed)
Pt complaint of abd pain with n/v and bright red rectal bleeding; onset this morning; pt tearful with triage; denies hx of same.

## 2019-04-01 NOTE — Progress Notes (Signed)
ED TO INPATIENT HANDOFF REPORT  Name/Age/Gender Gabrielle Roberts 48 y.o. female  Code Status   Home/SNF/Other Home  Chief Complaint Abdominal pain, Emesis, Rectal bleeding  Level of Care/Admitting Diagnosis ED Disposition    ED Disposition Condition [facility] Area: [place] [100102]  Level of Care: Stepdown [14]  Admit to SDU based on following criteria: Hemodynamic compromise or significant risk of instability:  Patient requiring short term acute titration and management of vasoactive drips, and invasive monitoring (i.e., CVP and Arterial line).  Covid Evaluation: Confirmed COVID Negative  Diagnosis: Lower GI bleed [824235]  Admitting Physician: Toy Baker [[id number]]  Attending Physician: Toy Baker [[id number]]  PT Class (Do Not Modify): Observation [104]  PT Acc Code (Do Not Modify): Observation [10022]       Medical History Past Medical History:  Diagnosis Date  . Anemia     Allergies No Known Allergies  IV Location/Drains/Wounds Patient Lines/Drains/Airways Status   Active Line/Drains/Airways    Name:   Placement date:   Placement time:   Site:   Days:   Peripheral IV 04/01/19 Right Antecubital   04/01/19    1549    Antecubital   less than 1   Peripheral IV 04/01/19 Left Antecubital   04/01/19    1712    Antecubital   less than 1          Labs/Imaging Results for orders placed or performed during the hospital encounter of 04/01/19 (from the past 48 hour(s))  ABO/Rh     Status: None   Collection Time: 04/01/19  1:55 PM  Result Value Ref Range   ABO/RH(D)      O POS Performed at [facility], [place] [address] [place], [place] [address]   Comprehensive metabolic panel     Status: Abnormal   Collection Time: 04/01/19  1:56 PM  Result Value Ref Range   Sodium 140 135 - 145 mmol/L   Potassium 3.6 3.5 - 5.1 mmol/L   Chloride 107 98 - 111 mmol/L   CO2 25 22 - 32 mmol/L   Glucose, Bld 93 70 -  99 mg/dL   BUN 9 6 - 20 mg/dL   Creatinine, Ser 0.76 0.44 - 1.00 mg/dL   Calcium 9.3 8.9 - 10.3 mg/dL   Total Protein 8.0 6.5 - 8.1 g/dL   Albumin 4.4 3.5 - 5.0 g/dL   AST 17 15 - 41 U/L   ALT 8 0 - 44 U/L   Alkaline Phosphatase 33 (L) 38 - 126 U/L   Total Bilirubin 0.2 (L) 0.3 - 1.2 mg/dL   GFR calc non Af Amer >60 >60 mL/min   GFR calc Af Amer >60 >60 mL/min   Anion gap 8 5 - 15    Comment: Performed at [facility], [place] [address] [place], [place] [address]  CBC     Status: Abnormal   Collection Time: 04/01/19  1:56 PM  Result Value Ref Range   WBC 7.4 4.0 - 10.5 K/uL   RBC 2.42 (L) 3.87 - 5.11 MIL/uL   Hemoglobin 3.7 (LL) 12.0 - 15.0 g/dL    Comment: REPEATED TO VERIFY Reticulocyte Hemoglobin testing may be clinically indicated, consider ordering this additional test [id number] THIS CRITICAL RESULT HAS VERIFIED AND BEEN CALLED TO K.ZULETA BY NATHAN THOMPSON ON 07 06 2020 AT 1500, AND HAS BEEN READ BACK. CRITICAL RESULT VERIFIED    HCT 15.6 (L) 36.0 - 46.0 %   MCV  64.5 (L) 80.0 - 100.0 fL   MCH 15.3 (L) 26.0 - 34.0 pg   MCHC 23.7 (L) 30.0 - 36.0 g/dL   RDW 36.2 (H) 11.5 - 15.5 %   Platelets 1,063 (HH) 150 - 400 K/uL    Comment: REPEATED TO VERIFY PLATELET COUNT CONFIRMED BY SMEAR SPECIMEN CHECKED FOR CLOTS THIS CRITICAL RESULT HAS VERIFIED AND BEEN CALLED TO K.ZULETA BY NATHAN THOMPSON ON 07 06 2020 AT 1500, AND HAS BEEN READ BACK. CRITICAL RESULT VERIFIED    nRBC 0.5 (H) 0.0 - 0.2 %    Comment: Performed at [facility], [place] [address], [place], [place] [address]  Type and screen [place]     Status: None (Preliminary result)   Collection Time: 04/01/19  1:56 PM  Result Value Ref Range   ABO/RH(D) O POS    Antibody Screen NEG    Sample Expiration 04/04/2019,2359    Unit Number [id number]    Blood Component Type RED CELLS,LR    Unit division 00    Status of Unit ALLOCATED    Transfusion Status OK TO  TRANSFUSE    Crossmatch Result Compatible    Unit Number [id number]    Blood Component Type RED CELLS,LR    Unit division 00    Status of Unit ALLOCATED    Transfusion Status OK TO TRANSFUSE    Crossmatch Result Compatible    Unit Number [id number]    Blood Component Type RED CELLS,LR    Unit division 00    Status of Unit ISSUED    Transfusion Status OK TO TRANSFUSE    Crossmatch Result      Compatible Performed at [facility], [place] [address] [place], [place] [address]    Unit Number [id number]    Blood Component Type RED CELLS,LR    Unit division 00    Status of Unit ALLOCATED    Transfusion Status OK TO TRANSFUSE    Crossmatch Result Compatible   Reticulocytes     Status: Abnormal   Collection Time: 04/01/19  1:56 PM  Result Value Ref Range   Retic Ct Pct 0.6 0.4 - 3.1 %   RBC. 2.42 (L) 3.87 - 5.11 MIL/uL   Retic Count, Absolute 14.4 (L) 19.0 - 186.0 K/uL   Immature Retic Fract 32.7 (H) 2.3 - 15.9 %    Comment: Performed at [facility], [place] [address] [place],  [address]  I-Stat beta hCG blood, ED     Status: None   Collection Time: 04/01/19  2:07 PM  Result Value Ref Range   I-stat hCG, quantitative <5.0 <5 mIU/mL   Comment 3            Comment:   GEST. AGE      CONC.  (mIU/mL)   <=1 WEEK        5 - 50     2 WEEKS       50 - 500     3 WEEKS       100 - 10,000     4 WEEKS     1,000 - 30,000        FEMALE AND NON-PREGNANT FEMALE:     LESS THAN 5 mIU/mL   POC occult blood, ED     Status: None   Collection Time: 04/01/19  2:17 PM  Result Value Ref Range   Fecal Occult Bld NEGATIVE NEGATIVE  Prepare RBC     Status: None  Collection Time: 04/01/19  3:30 PM  Result Value Ref Range   Order Confirmation      ORDER PROCESSED BY BLOOD BANK Performed at [place] [address], [place], [place] [address]   Protime-INR     Status: None   Collection Time: 04/01/19  5:12 PM  Result Value Ref  Range   Prothrombin Time 14.4 11.4 - 15.2 seconds   INR 1.1 0.8 - 1.2    Comment: (NOTE) INR goal varies based on device and disease states. Performed at [facility], [place] [address], [place], [place] [address]   PTT     Status: None   Collection Time: 04/01/19  5:12 PM  Result Value Ref Range   aPTT 29 24 - 36 seconds    Comment: Performed at [facility], [place] [address] [place], [place] [address]  Vitamin B12     Status: None   Collection Time: 04/01/19  5:12 PM  Result Value Ref Range   Vitamin B-12 280 180 - 914 pg/mL    Comment: (NOTE) This assay is not validated for testing neonatal or myeloproliferative syndrome specimens for Vitamin B12 levels. Performed at [facility], [place] [address] [place], [place] [address]   Folate     Status: None   Collection Time: 04/01/19  5:12 PM  Result Value Ref Range   Folate 11.1 >5.9 ng/mL    Comment: Performed at [facility], [place] [address], [place], [place] [address]  Iron and TIBC     Status: Abnormal   Collection Time: 04/01/19  5:12 PM  Result Value Ref Range   Iron 10 (L) 28 - 170 ug/dL   TIBC 522 (H) 250 - 450 ug/dL   Saturation Ratios 2 (L) 10.4 - 31.8 %   UIBC 512 ug/dL    Comment: Performed at [facility], [place] [address] [place], [place] [address]  Ferritin     Status: Abnormal   Collection Time: 04/01/19  5:12 PM  Result Value Ref Range   Ferritin 2 (L) 11 - 307 ng/mL    Comment: Performed at [facility], [place] [address], [place], [place] [address]  SARS Coronavirus 2 (CEPHEID - Performed in [place] hospital lab), Hosp Order     Status: None   Collection Time: 04/01/19  5:12 PM   Specimen: Nasopharyngeal Swab  Result Value Ref Range   SARS Coronavirus 2 NEGATIVE NEGATIVE    Comment: (NOTE) If result is NEGATIVE SARS-CoV-2 target nucleic acids are NOT DETECTED. The SARS-CoV-2 RNA is generally  detectable in upper and lower  respiratory specimens during the acute phase of infection. The lowest  concentration of SARS-CoV-2 viral copies this assay can detect is 250  copies / mL. A negative result does not preclude SARS-CoV-2 infection  and should not be used as the sole basis for treatment or other  patient management decisions.  A negative result may occur with  improper specimen collection / handling, submission of specimen other  than nasopharyngeal swab, presence of viral mutation(s) within the  areas targeted by this assay, and inadequate number of viral copies  (<250 copies / mL). A negative result must be combined with clinical  observations, patient history, and epidemiological information. If result is POSITIVE SARS-CoV-2 target nucleic acids are DETECTED. The SARS-CoV-2 RNA is generally detectable in upper and lower  respiratory specimens dur ing the acute phase of infection.  Positive  results are  indicative of active infection with SARS-CoV-2.  Clinical  correlation with patient history and other diagnostic information is  necessary to determine patient infection status.  Positive results do  not rule out bacterial infection or co-infection with other viruses. If result is PRESUMPTIVE POSTIVE SARS-CoV-2 nucleic acids MAY BE PRESENT.   A presumptive positive result was obtained on the submitted specimen  and confirmed on repeat testing.  While 2019 novel coronavirus  (SARS-CoV-2) nucleic acids may be present in the submitted sample  additional confirmatory testing may be necessary for epidemiological  and / or clinical management purposes  to differentiate between  SARS-CoV-2 and other Sarbecovirus currently known to infect humans.  If clinically indicated additional testing with an alternate test  methodology [phone]) is advised. The SARS-CoV-2 RNA is generally  detectable in upper and lower respiratory sp ecimens during the acute  phase of infection. The  expected result is Negative. Fact Sheet for Patients:  StrictlyIdeas.no Fact Sheet for Healthcare Providers: BankingDealers.co.za This test is not yet approved or cleared by the [place] FDA and has been authorized for detection and/or diagnosis of SARS-CoV-2 by FDA under an Emergency Use Authorization (EUA).  This EUA will remain in effect (meaning this test can be used) for the duration of the COVID-19 declaration under Section 564(b)(1) of the Act, 21 U.S.C. section 360bbb-3(b)(1), unless the authorization is terminated or revoked sooner. Performed at [facility], [place] [address], [place], [place] [address]    Ct Abdomen Pelvis W Contrast  Result Date: 04/01/2019 CLINICAL DATA:  Abdominal pain with nausea and vomiting. Rectal bleeding EXAM: CT ABDOMEN AND PELVIS WITH CONTRAST TECHNIQUE: Multidetector CT imaging of the abdomen and pelvis was performed using the standard protocol following bolus administration of intravenous contrast. CONTRAST:  140mL OMNIPAQUE IOHEXOL 300 MG/ML  SOLN COMPARISON:  July 10, 2014 FINDINGS: Lower chest: There is stable scarring in the lateral left base. There is no lung base edema or consolidation. Hepatobiliary: There are innumerable cysts throughout the liver, also present on previous study. There is a Riedel's lobe on the right, an anatomic variant. Pancreas: There is no evident pancreatic mass or inflammatory focus. Spleen: There is an apparent cyst near the dome of the spleen measuring 6 mm. No other splenic lesions evident. Adrenals/Urinary Tract: Adrenals bilaterally appear normal. Kidneys bilaterally show no evident mass or hydronephrosis on either side. Suspect prominent column of Bertin on the right, an anatomic variant. There is a 2 mm calculus in the lower pole of the right kidney. There is no evident ureteral calculus on either side. Urinary bladder is midline with wall thickness  within normal limits. Stomach/Bowel: There is no appreciable bowel wall or mesenteric thickening. No evident bowel obstruction. Terminal ileum appears unremarkable. There is no free air or portal venous air. Vascular/Lymphatic: There is no abdominal aortic aneurysm. No vascular lesions are demonstrable on this study. There is no appreciable adenopathy in the abdomen or pelvis. Reproductive: The uterus is anteverted. The uterus is prominent and somewhat lobular in contour, likely indicative of leiomyomatous change. There is a 3.0 x 2.8 cm likely dominant follicle right ovary. Other: Appendix appears normal. No evident abscess or ascites in the abdomen or pelvis. Musculoskeletal: No blastic or lytic bone lesions. No intramuscular or abdominal wall lesions are evident. IMPRESSION: 1. 2 mm nonobstructing calculus lower pole left kidney. No hydronephrosis or ureteral calculus on either side. Urinary bladder wall thickness normal. Note that the areas of inhomogeneous enhancement in the kidneys noted on previous  study are no longer evident. Enhancement is symmetric bilaterally currently. 2. No bowel obstruction. No abscess in the abdomen or pelvis. Appendix appears normal. 3. Apparent leiomyomatous uterus. Probable dominant follicle right ovary. 4.  Innumerable apparent liver cysts, a stable finding. Electronically Signed   By: Lowella Grip III M.D.   On: 04/01/2019 17:52    Pending Labs [employer] (From admission, onward)    Start     Ordered   Signed and Held  HIV antibody (Routine Testing)  Tomorrow morning,   R     Signed and Held   Signed and Held  Magnesium  Tomorrow morning,   R    Comments: Call MD if <1.5    Signed and Held   Signed and Held  Phosphorus  Tomorrow morning,   R     Signed and Held   Signed and Held  TSH  Once,   R    Comments: Cancel if already done within 1 month and notify MD    Signed and Held   Signed and Held  Comprehensive metabolic panel  Once,   R    Comments:  Cal MD for K<3.5 or >5.0    Signed and Held   Signed and Held  CBC  Once,   R    Comments: Call for hg <8.0    Signed and Held   Signed and Held  CBC  Now then every 6 hours,   STAT    Comments: Call MD if Hg <8, plt <100    Signed and Held          Vitals/Pain Today's Vitals   04/01/19 1900 04/01/19 1930 04/01/19 2015 04/01/19 2110  BP: (!) 138/91 (!) 144/94  125/83  Pulse: 91  81 82  Resp:   (!) 23 (!) 21  Temp:    98.9 F (37.2 C)  TempSrc:    Oral  SpO2: 100%  100% 100%  Weight:      Height:      PainSc:        Isolation Precautions No active isolations  Medications Medications  0.9 %  sodium chloride infusion (Manually program via Guardrails IV Fluids) (has no administration in time range)  sodium chloride (PF) 0.9 % injection (has no administration in time range)  ondansetron (ZOFRAN) injection 4 mg (4 mg Intravenous Given 04/01/19 1713)  sodium chloride 0.9 % bolus 500 mL (500 mLs Intravenous New Bag/Given 04/01/19 1549)  iohexol (OMNIPAQUE) 300 MG/ML solution 100 mL (100 mLs Intravenous Contrast Given 04/01/19 1735)    Mobility walks

## 2019-04-01 NOTE — H&P (Addendum)
Gabrielle Roberts RSW:[id number] DOB: 03/07/1971 DOA: 04/01/2019     PCP: Patient, No Pcp Per   Outpatient Specialists:   Patient arrived to ER on 04/01/19 at 1235  Patient coming from: home Lives  With family    Chief Complaint:  Chief Complaint  Patient presents with   Abdominal Pain   Rectal Bleeding    HPI: Gabrielle Roberts is a 48 y.o. female with medical history significant of anemia    Presented with nausea and vomiting and bright red rectal bleeding started this morning Only one episode of bright red blood and currently resolved.  She noted some blood on the tissue in the commode.  She has no prior history of similar presentation.  No black stools no vomiting no hematemesis she had a mild abdominal pain prior to this episode.  Felt like cramping left lower quadrant.  Pain has resolved at this point. Patient has had some shortness of breath and dyspnea on exertion With more some mild nausea vomiting for the past few days no diarrhea no constipation no fevers or chills Reports never had a a colonoscopy She has been told that she was anemic 17 years ago with her last pregnancy A pad would last only a few hours and she had to use tampons wit it For the past 2 months  For the past month she has been eating ice, dyspneic and chest pain with exertion No syncope She feels dizzy when tries to stand up She is not aware of BP problems  She takes only tylenol over the counter for pain no NSAIDS    Infectious risk factors:  Reports  shortness of breath  In  ER RAPID COVID TEST NEGATIVE     Regarding pertinent Chronic problems:  none      While in ER: Cardiac up to 125  Globin noted to be 3.7 Patient typed and screened and transfused 2 units Rectal exam performed showing no stool in rectal vault but multiple hemorrhoids  CT abdomen and pelvis showed no evidence of diverticulitis Anemia panel consistent with iron deficiency  The following Work up has been ordered so  far:  Orders Placed This Encounter  Procedures   SARS Coronavirus 2 (CEPHEID - Performed in [facility] hospital lab), Hosp Order   CT ABDOMEN PELVIS W CONTRAST   Comprehensive metabolic panel   CBC   Protime-INR   PTT   Vitamin B12   Folate   Iron and TIBC   Ferritin   Reticulocytes   Diet NPO time specified   Orthostatic Vital signs   Place X2 Large Bore IV's   Initiate Carrier Fluid Protocol   Practitioner attestation of consent   Complete patient signature process for consent form   Check Rectal Temperature   Cardiac monitoring   Consult to hospitalist  ALL PATIENTS BEING ADMITTED/HAVING PROCEDURES NEED COVID-19 SCREENING   Consult to gastroenterology  ALL PATIENTS BEING ADMITTED/HAVING PROCEDURES NEED COVID-19 SCREENING   Pulse oximetry, continuous   POC occult blood, ED   I-Stat beta hCG blood, ED   EKG 12-Lead   EKG 12-Lead   Type and screen [place]   ABO/Rh   Prepare RBC   Place in observation (patient's expected length of stay will be less than 2 midnights)    Following Medications were ordered in ER: Medications  0.9 %  sodium chloride infusion (Manually program via Guardrails IV Fluids) (has no administration in time range)  sodium chloride (PF) 0.9 %  injection (has no administration in time range)  ondansetron (ZOFRAN) injection 4 mg (4 mg Intravenous Given 04/01/19 1713)  sodium chloride 0.9 % bolus 500 mL (500 mLs Intravenous New Bag/Given 04/01/19 1549)  iohexol (OMNIPAQUE) 300 MG/ML solution 100 mL (100 mLs Intravenous Contrast Given 04/01/19 1735)        Consult Orders  (From admission, onward)         Start     Ordered   04/01/19 1758  Consult to hospitalist  ALL PATIENTS BEING ADMITTED/HAVING PROCEDURES NEED COVID-19 SCREENING  Once    Comments: ALL PATIENTS BEING ADMITTED/HAVING PROCEDURES NEED COVID-19 SCREENING  Provider:  (Not yet assigned)  Question Answer Comment  Place call to: [facility]   Reason for Consult Admit      04/01/19 1757          ER Provider Called:  Howie Ill   Dr.Outlaw They Recommend admit to medicine   Will see in AM   Significant initial  Findings: Abnormal Labs Reviewed  COMPREHENSIVE METABOLIC PANEL - Abnormal; Notable for the following components:      Result Value   Alkaline Phosphatase 33 (*)    Total Bilirubin 0.2 (*)    All other components within normal limits  CBC - Abnormal; Notable for the following components:   RBC 2.42 (*)    Hemoglobin 3.7 (*)    HCT 15.6 (*)    MCV 64.5 (*)    MCH 15.3 (*)    MCHC 23.7 (*)    RDW 36.2 (*)    Platelets 1,063 (*)    nRBC 0.5 (*)    All other components within normal limits  IRON AND TIBC - Abnormal; Notable for the following components:   Iron 10 (*)    TIBC 522 (*)    Saturation Ratios 2 (*)    All other components within normal limits  FERRITIN - Abnormal; Notable for the following components:   Ferritin 2 (*)    All other components within normal limits  RETICULOCYTES - Abnormal; Notable for the following components:   RBC. 2.42 (*)    Retic Count, Absolute 14.4 (*)    Immature Retic Fract 32.7 (*)    All other components within normal limits     Otherwise labs showing:    Recent Labs  Lab 04/01/19 1356  NA 140  K 3.6  CO2 25  GLUCOSE 93  BUN 9  CREATININE 0.76  CALCIUM 9.3    Cr  stable,    Lab Results  Component Value Date   CREATININE 0.76 04/01/2019   CREATININE 0.95 07/10/2014   CREATININE 0.80 01/27/2012    Recent Labs  Lab 04/01/19 1356  AST 17  ALT 8  ALKPHOS 33*  BILITOT 0.2*  PROT 8.0  ALBUMIN 4.4   Lab Results  Component Value Date   CALCIUM 9.3 04/01/2019       WBC       Component Value Date/Time   WBC 7.4 04/01/2019 1356   ANC    Component Value Date/Time   NEUTROABS 7.6 07/10/2014 1159     Plt: Lab Results  Component Value Date   PLT 1,063 ([place]) 04/01/2019      COVID-19 Labs    Lab Results  Component Value  Date   SARSCOV2NAA NEGATIVE 04/01/2019     HG/HCT  No prior labs      Component Value Date/Time   HGB 3.7 (LL) 04/01/2019 1356   HCT 15.6 (L) 04/01/2019 1356  UA  not ordered   CTabd/pelvis -  Non-acute, fibroids in the uterus and chronic liver cysts    ECG:  Personally reviewed by me showing: HR :  85 Rhythm:  NSR,   no evidence of ischemic changes QTC 419     ED Triage Vitals  Enc Vitals Group     BP 04/01/19 1324 (!) 188/112     Pulse Rate 04/01/19 1324 (!) 125     Resp 04/01/19 1324 20     Temp 04/01/19 1324 99.1 F (37.3 C)     Temp Source 04/01/19 1324 Oral     SpO2 04/01/19 1324 100 %     Weight 04/01/19 1332 185 lb (83.9 kg)     Height 04/01/19 1332 5\' 4"  (1.626 m)     Head Circumference --      Peak Flow --      Pain Score 04/01/19 1332 6     Pain Loc --      Pain Edu? --      Excl. in [place]? --   TMAX(24)@       Latest  Blood pressure 118/79, pulse 88, temperature 98.7 F (37.1 C), temperature source Oral, resp. rate 18, height 5\' 4"  (1.626 m), weight 83.9 kg, last menstrual period 03/25/2019, SpO2 100 %.    Hospitalist was called for admission for symptomatic anemia lower GI bleed   Review of Systems:    Pertinent positives include:  abdominal pain,   Constitutional:  No weight loss, night sweats, Fevers, chills, fatigue, weight loss  HEENT:  No headaches, Difficulty swallowing,Tooth/dental problems,Sore throat,  No sneezing, itching, ear ache, nasal congestion, post nasal drip,  Cardio-vascular:  No chest pain, Orthopnea, PND, anasarca, dizziness, palpitations.no Bilateral lower extremity swelling  GI:  No heartburn, indigestion,nausea, vomiting, diarrhea, change in bowel habits, loss of appetite, melena, blood in stool, hematemesis Resp:  no shortness of breath at rest. No dyspnea on exertion, No excess mucus, no productive cough, No non-productive cough, No coughing up of blood.No change in color of mucus.No wheezing. Skin:  no rash or  lesions. No jaundice GU:  no dysuria, change in color of urine, no urgency or frequency. No straining to urinate.  No flank pain.  Musculoskeletal:  No joint pain or no joint swelling. No decreased range of motion. No back pain.  Psych:  No change in mood or affect. No depression or anxiety. No memory loss.  Neuro: no localizing neurological complaints, no tingling, no weakness, no double vision, no gait abnormality, no slurred speech, no confusion  All systems reviewed and apart from [place] all are negative  Past Medical History:   Past Medical History:  Diagnosis Date   Anemia       Past Surgical History:  Procedure Laterality Date   ORIF ZYGOMATIC FRACTURE  01/27/2012   Procedure: OPEN REDUCTION INTERNAL FIXATION (ORIF) ZYGOMATIC FRACTURE;  Surgeon: Ascencion Dike, MD;  Location: [facility];  Service: ENT;  Laterality: Right;  repair left lip laceration    Social History:  Ambulatory   independently       reports that she has never smoked. She has never used smokeless tobacco. She reports current alcohol use. She reports that she does not use drugs.   Family History:   Family History  Problem Relation Age of Onset   Diabetes Mother    Hypertension Mother     Allergies: No Known Allergies   Prior to Admission medications   Medication Sig Start Date End  Date Taking? Authorizing Provider  cyclobenzaprine (FLEXERIL) 10 MG tablet Take 1 tablet (10 mg total) by mouth 2 (two) times daily as needed for muscle spasms. Patient not taking: Reported on 04/01/2019 01/10/17   McDonald, Maree Erie A, PA-C  HYDROcodone-acetaminophen (NORCO/VICODIN) 5-325 MG tablet Take 2 tablets by mouth every 4 (four) hours as needed. Patient not taking: Reported on 04/01/2019 10/17/17   Lacretia Leigh, MD  ibuprofen (ADVIL,MOTRIN) 600 MG tablet Take 1 tablet (600 mg total) by mouth every 6 (six) hours as needed. Patient not taking: Reported on 04/01/2019 10/17/17   Lacretia Leigh, MD  methocarbamol (ROBAXIN-750)  750 MG tablet Take 1 tablet (750 mg total) by mouth 4 (four) times daily. Patient not taking: Reported on 04/01/2019 10/17/17   Lacretia Leigh, MD   Physical Exam: Blood pressure 118/79, pulse 88, temperature 98.7 F (37.1 C), temperature source Oral, resp. rate 18, height 5\' 4"  (1.626 m), weight 83.9 kg, last menstrual period 03/25/2019, SpO2 100 %. 1. General:  in No Acute distress   well -appearing but pale 2. Psychological: Alert and Oriented 3. Head/ENT:     Dry Mucous Membranes                          Head Non traumatic, neck supple                           Poor Dentition 4. SKIN:   decreased Skin turgor,  Skin clean Dry and intact no rash 5. Heart: Regular rate and rhythm no  Murmur, no Rub or gallop 6. Lungs: Clear to auscultation bilaterally, no wheezes or crackles   7. Abdomen: Soft,  non-tender, Non distended obese bowel sounds present 8. Lower extremities: no clubbing, cyanosis, no  edema 9. Neurologically Grossly intact, moving all 4 extremities equally  10. MSK: Normal range of motion   All other LABS:     Recent Labs  Lab 04/01/19 1356  WBC 7.4  HGB 3.7*  HCT 15.6*  MCV 64.5*  PLT 1,063*     Recent Labs  Lab 04/01/19 1356  NA 140  K 3.6  CL 107  CO2 25  GLUCOSE 93  BUN 9  CREATININE 0.76  CALCIUM 9.3     Recent Labs  Lab 04/01/19 1356  AST 17  ALT 8  ALKPHOS 33*  BILITOT 0.2*  PROT 8.0  ALBUMIN 4.4       Cultures:    Component Value Date/Time   SDES URINE, CLEAN CATCH 07/10/2014 1152   [place] [id number] 07/10/2014 1152   CULT  07/10/2014 1152    Multiple bacterial morphotypes present, none predominant. Suggest appropriate recollection if clinically indicated. Performed at [facility] 07/11/2014 FINAL 07/10/2014 1152     Radiological Exams on Admission: Ct Abdomen Pelvis W Contrast  Result Date: 04/01/2019 CLINICAL DATA:  Abdominal pain with nausea and vomiting. Rectal bleeding EXAM: CT ABDOMEN AND  PELVIS WITH CONTRAST TECHNIQUE: Multidetector CT imaging of the abdomen and pelvis was performed using the standard protocol following bolus administration of intravenous contrast. CONTRAST:  123mL OMNIPAQUE IOHEXOL 300 MG/ML  SOLN COMPARISON:  July 10, 2014 FINDINGS: Lower chest: There is stable scarring in the lateral left base. There is no lung base edema or consolidation. Hepatobiliary: There are innumerable cysts throughout the liver, also present on previous study. There is a Riedel's lobe on the right, an anatomic variant. Pancreas: There is  no evident pancreatic mass or inflammatory focus. Spleen: There is an apparent cyst near the dome of the spleen measuring 6 mm. No other splenic lesions evident. Adrenals/Urinary Tract: Adrenals bilaterally appear normal. Kidneys bilaterally show no evident mass or hydronephrosis on either side. Suspect prominent column of Bertin on the right, an anatomic variant. There is a 2 mm calculus in the lower pole of the right kidney. There is no evident ureteral calculus on either side. Urinary bladder is midline with wall thickness within normal limits. Stomach/Bowel: There is no appreciable bowel wall or mesenteric thickening. No evident bowel obstruction. Terminal ileum appears unremarkable. There is no free air or portal venous air. Vascular/Lymphatic: There is no abdominal aortic aneurysm. No vascular lesions are demonstrable on this study. There is no appreciable adenopathy in the abdomen or pelvis. Reproductive: The uterus is anteverted. The uterus is prominent and somewhat lobular in contour, likely indicative of leiomyomatous change. There is a 3.0 x 2.8 cm likely dominant follicle right ovary. Other: Appendix appears normal. No evident abscess or ascites in the abdomen or pelvis. Musculoskeletal: No blastic or lytic bone lesions. No intramuscular or abdominal wall lesions are evident. IMPRESSION: 1. 2 mm nonobstructing calculus lower pole left kidney. No  hydronephrosis or ureteral calculus on either side. Urinary bladder wall thickness normal. Note that the areas of inhomogeneous enhancement in the kidneys noted on previous study are no longer evident. Enhancement is symmetric bilaterally currently. 2. No bowel obstruction. No abscess in the abdomen or pelvis. Appendix appears normal. 3. Apparent leiomyomatous uterus. Probable dominant follicle right ovary. 4.  Innumerable apparent liver cysts, a stable finding. Electronically Signed   By: Lowella Grip III M.D.   On: 04/01/2019 17:52    Chart has been reviewed   Assessment/Plan  48 y.o. female with medical history significant of anemia Admitted for symptomatic anemia lower GI bleed Present on Admission:  Lower GI bleed - - Suspect Lower Gi source No hx of PUD,  melena,  BUN elevation to  suggest otherwise  - Admit  For further management given:  gross rectal bleeding,  - most likely Diverticular vs hemorrhoids source -   painless bleeding making colitis less likely      - Admit to stepdown given  severe anemia    -  ER  Provider spoke to gastroenterology ( [facility] ) they will see patient in a.m. appreciate their consult   - serial CBC.    - Monitor for any recurrence,  evidence of hemodynamic instability or significant blood loss -  type and screen,  - Transfuse 2 units and repeat if needed  - Establish at least 2 PIV and fluid resuscitate   - clear liquids for tonight keep nothing by mouth post midnight,  -  monitor for Recurrent significant  Bleeding of red blood and hemodynamic instability in which case Bleeding scan and IR consult would be indicated.    Symptomatic anemia -likely chronic with acute worsening secondary to GI bleed/heavy menstrual bleeding.  Transfuse 2 units and repeat CBC will likely need additional transfusions Patient with evidence of fibroids in the uterus per CT scan will need to follow-up with OB/GYN as an outpatient  Elevated blood pressure reading -  No  knonwn hx of HTN, currently improving  Thrombocytosis (HCC) -setting of severe anemia most likely reactive   Other plan as per orders.  DVT prophylaxis:  SCD    Code Status:  FULL CODE as per patient   I had personally discussed CODE  STATUS with patient    Family Communication:   Family not at  Bedside    Disposition Plan:   To home once workup is complete and patient is stable                                         Consults called:   Howie Ill Outlaw  Admission status:  ED Disposition    ED Disposition Condition [place]: [place] [[id number]]  Level of Care: Stepdown [14]  Admit to SDU based on following criteria: Hemodynamic compromise or significant risk of instability:  Patient requiring short term acute titration and management of vasoactive drips, and invasive monitoring (i.e., CVP and Arterial line).  Covid Evaluation: Confirmed COVID Negative  Diagnosis: Lower GI bleed [[id number]]  Admitting Physician: Toy Baker [[id number]]  Attending Physician: Toy Baker [[id number]]  PT Class (Do Not Modify): Observation [104]  PT Acc Code (Do Not Modify): Observation [10022]       Obs    Level of care       SDU tele indefinitely please discontinue once patient no longer qualifies  Precautions:  NONE  No active isolations  PPE: Used by the provider:   P100  eye Goggles,  Gloves      Gabrielle Roberts 04/01/2019, 9:09 PM    [facility]     after 2 AM please page floor coverage PA If 7AM-7PM, please contact the day team taking care of the patient using [email or web address]

## 2019-04-01 NOTE — ED Notes (Signed)
Critical hgb 3.7 Platelet 1063

## 2019-04-02 ENCOUNTER — Observation Stay (HOSPITAL_COMMUNITY): Payer: Commercial Managed Care - PPO

## 2019-04-02 DIAGNOSIS — K625 Hemorrhage of anus and rectum: Secondary | ICD-10-CM | POA: Diagnosis not present

## 2019-04-02 DIAGNOSIS — Z1159 Encounter for screening for other viral diseases: Secondary | ICD-10-CM | POA: Diagnosis not present

## 2019-04-02 DIAGNOSIS — K640 First degree hemorrhoids: Secondary | ICD-10-CM | POA: Diagnosis not present

## 2019-04-02 DIAGNOSIS — K922 Gastrointestinal hemorrhage, unspecified: Secondary | ICD-10-CM | POA: Diagnosis not present

## 2019-04-02 DIAGNOSIS — D500 Iron deficiency anemia secondary to blood loss (chronic): Secondary | ICD-10-CM | POA: Diagnosis not present

## 2019-04-02 DIAGNOSIS — D649 Anemia, unspecified: Secondary | ICD-10-CM | POA: Diagnosis not present

## 2019-04-02 DIAGNOSIS — R030 Elevated blood-pressure reading, without diagnosis of hypertension: Secondary | ICD-10-CM | POA: Diagnosis not present

## 2019-04-02 LAB — COMPREHENSIVE METABOLIC PANEL
ALT: 7 U/L (ref 0–44)
AST: 14 U/L — ABNORMAL LOW (ref 15–41)
Albumin: 3.8 g/dL (ref 3.5–5.0)
Alkaline Phosphatase: 31 U/L — ABNORMAL LOW (ref 38–126)
Anion gap: 6 (ref 5–15)
BUN: 8 mg/dL (ref 6–20)
CO2: 23 mmol/L (ref 22–32)
Calcium: 8.7 mg/dL — ABNORMAL LOW (ref 8.9–10.3)
Chloride: 107 mmol/L (ref 98–111)
Creatinine, Ser: 0.58 mg/dL (ref 0.44–1.00)
GFR calc Af Amer: >60 mL/min (ref >60)
GFR calc non Af Amer: >60 mL/min (ref >60)
Glucose, Bld: 90 mg/dL (ref 70–99)
Potassium: 3.6 mmol/L (ref 3.5–5.1)
Sodium: 136 mmol/L (ref 135–145)
Total Bilirubin: 0.9 mg/dL (ref 0.3–1.2)
Total Protein: 6.9 g/dL (ref 6.5–8.1)

## 2019-04-02 LAB — CBC
HCT: 21.2 % — ABNORMAL LOW (ref 36.0–46.0)
HCT: 29.1 % — ABNORMAL LOW (ref 36.0–46.0)
Hemoglobin: 5.8 g/dL — CL (ref 12.0–15.0)
Hemoglobin: 8.6 g/dL — ABNORMAL LOW (ref 12.0–15.0)
MCH: 20.4 pg — ABNORMAL LOW (ref 26.0–34.0)
MCH: 23.2 pg — ABNORMAL LOW (ref 26.0–34.0)
MCHC: 27.4 g/dL — ABNORMAL LOW (ref 30.0–36.0)
MCHC: 29.6 g/dL — ABNORMAL LOW (ref 30.0–36.0)
MCV: 74.4 fL — ABNORMAL LOW (ref 80.0–100.0)
MCV: 78.4 fL — ABNORMAL LOW (ref 80.0–100.0)
Platelets: 868 10*3/uL — ABNORMAL HIGH (ref 150–400)
Platelets: 687 10*3/uL — ABNORMAL HIGH (ref 150–400)
RBC: 2.85 MIL/uL — ABNORMAL LOW (ref 3.87–5.11)
RBC: 3.71 MIL/uL — ABNORMAL LOW (ref 3.87–5.11)
WBC: 8.6 10*3/uL (ref 4.0–10.5)
WBC: 9 10*3/uL (ref 4.0–10.5)
nRBC: 0.5 % — ABNORMAL HIGH (ref 0.0–0.2)
nRBC: 0.8 % — ABNORMAL HIGH (ref 0.0–0.2)

## 2019-04-02 LAB — HIV ANTIBODY (ROUTINE TESTING W REFLEX): HIV Screen 4th Generation wRfx: NONREACTIVE

## 2019-04-02 LAB — TSH: TSH: 2.152 u[IU]/mL (ref 0.350–4.500)

## 2019-04-02 LAB — MAGNESIUM: Magnesium: 2 mg/dL (ref 1.7–2.4)

## 2019-04-02 LAB — HEMOGLOBIN AND HEMATOCRIT, BLOOD
HCT: 28.6 % — ABNORMAL LOW (ref 36.0–46.0)
Hemoglobin: 8.7 g/dL — ABNORMAL LOW (ref 12.0–15.0)

## 2019-04-02 LAB — PHOSPHORUS: Phosphorus: 3.6 mg/dL (ref 2.5–4.6)

## 2019-04-02 MED ORDER — PEG 3350-KCL-NA BICARB-NACL 420 G PO SOLR
4000.0000 mL | Freq: Once | ORAL | Status: AC
  Administered 2019-04-02: 15:00:00 4000 mL via ORAL
  Filled 2019-04-02: qty 4000

## 2019-04-02 MED ORDER — MAGNESIUM HYDROXIDE 400 MG/5ML PO SUSP
30.0000 mL | Freq: Once | ORAL | Status: AC
  Administered 2019-04-02: 30 mL via ORAL
  Filled 2019-04-02: qty 30

## 2019-04-02 MED ORDER — SODIUM CHLORIDE 0.9 % IV SOLN
510.0000 mg | Freq: Once | INTRAVENOUS | Status: AC
  Administered 2019-04-02: 12:00:00 510 mg via INTRAVENOUS
  Filled 2019-04-02: qty 17

## 2019-04-02 NOTE — H&P (View-Only) (Signed)
[facility] CON[facility] Reason for consult: Lower GI bleed with anemia Referring Physician: [facility] hospitalist.  PCP: None primary GI: None  Gabrielle Roberts is an 48 y.o. female.  HPI: 48 year old woman who has had issues with increased vaginal bleeding and anemia.  She notes that on the day of admission she had some nausea and vomiting but this was clearish material.  She had bright red blood yesterday with her bowel movements has had some cramping.  This all feels much better today.  She has had no melena or hematemesis had some mild left lower quadrant pain.  No prior colonoscopy no family history of colon cancer she has been short of breath and fatigue lately.  Her hemoglobin was 3.7.  She has been transfused and hemoglobin is back up to 8.7 with transfusion.  She feels much better.  Past Medical History:  Diagnosis Date  . Anemia     Past Surgical History:  Procedure Laterality Date  . ORIF ZYGOMATIC FRACTURE  01/27/2012   Procedure: OPEN REDUCTION INTERNAL FIXATION (ORIF) ZYGOMATIC FRACTURE;  Surgeon: Ascencion Dike, MD;  Location: [facility];  Service: ENT;  Laterality: Right;  repair left lip laceration    Family History  Problem Relation Age of Onset  . Diabetes Mother   . Hypertension Mother     Social History:  reports that she has never smoked. She has never used smokeless tobacco. She reports current alcohol use. She reports that she does not use drugs.  Allergies: No Known Allergies  Medications; Prior to Admission medications   Medication Sig Start Date End Date Taking? Authorizing Provider  cyclobenzaprine (FLEXERIL) 10 MG tablet Take 1 tablet (10 mg total) by mouth 2 (two) times daily as needed for muscle spasms. Patient not taking: Reported on 04/01/2019 01/10/17   McDonald, Maree Erie A, PA-C  HYDROcodone-acetaminophen (NORCO/VICODIN) 5-325 MG tablet Take 2 tablets by mouth every 4 (four) hours as needed. Patient not taking: Reported on 04/01/2019 10/17/17   Lacretia Leigh, MD   ibuprofen (ADVIL,MOTRIN) 600 MG tablet Take 1 tablet (600 mg total) by mouth every 6 (six) hours as needed. Patient not taking: Reported on 04/01/2019 10/17/17   Lacretia Leigh, MD  methocarbamol (ROBAXIN-750) 750 MG tablet Take 1 tablet (750 mg total) by mouth 4 (four) times daily. Patient not taking: Reported on 04/01/2019 10/17/17   Lacretia Leigh, MD   . sodium chloride   Intravenous Once  . Chlorhexidine Gluconate Cloth  6 each Topical Daily  . magnesium hydroxide  30 mL Oral Once   PRN Meds acetaminophen **OR** acetaminophen, HYDROcodone-acetaminophen, ondansetron **OR** ondansetron (ZOFRAN) IV Results for orders placed or performed during the hospital encounter of 04/01/19 (from the past 48 hour(s))  ABO/Rh     Status: None   Collection Time: 04/01/19  1:55 PM  Result Value Ref Range   ABO/RH(D)      O POS Performed at [facility] [address], [place], [place] [address]   Comprehensive metabolic panel     Status: Abnormal   Collection Time: 04/01/19  1:56 PM  Result Value Ref Range   Sodium 140 135 - 145 mmol/L   Potassium 3.6 3.5 - 5.1 mmol/L   Chloride 107 98 - 111 mmol/L   CO2 25 22 - 32 mmol/L   Glucose, Bld 93 70 - 99 mg/dL   BUN 9 6 - 20 mg/dL   Creatinine, Ser 0.76 0.44 - 1.00 mg/dL   Calcium 9.3 8.9 - 10.3 mg/dL   Total  Protein 8.0 6.5 - 8.1 g/dL   Albumin 4.4 3.5 - 5.0 g/dL   AST 17 15 - 41 U/L   ALT 8 0 - 44 U/L   Alkaline Phosphatase 33 (L) 38 - 126 U/L   Total Bilirubin 0.2 (L) 0.3 - 1.2 mg/dL   GFR calc non Af Amer >60 >60 mL/min   GFR calc Af Amer >60 >60 mL/min   Anion gap 8 5 - 15    Comment: Performed at [facility], [place] [address], [place], [place] [address]  CBC     Status: Abnormal   Collection Time: 04/01/19  1:56 PM  Result Value Ref Range   WBC 7.4 4.0 - 10.5 K/uL   RBC 2.42 (L) 3.87 - 5.11 MIL/uL   Hemoglobin 3.7 (LL) 12.0 - 15.0 g/dL    Comment: REPEATED TO VERIFY Reticulocyte Hemoglobin  testing may be clinically indicated, consider ordering this additional test [id number] THIS CRITICAL RESULT HAS VERIFIED AND BEEN CALLED TO K.ZULETA BY NATHAN THOMPSON ON 07 06 2020 AT 1500, AND HAS BEEN READ BACK. CRITICAL RESULT VERIFIED    HCT 15.6 (L) 36.0 - 46.0 %   MCV 64.5 (L) 80.0 - 100.0 fL   MCH 15.3 (L) 26.0 - 34.0 pg   MCHC 23.7 (L) 30.0 - 36.0 g/dL   RDW 36.2 (H) 11.5 - 15.5 %   Platelets 1,063 (HH) 150 - 400 K/uL    Comment: REPEATED TO VERIFY PLATELET COUNT CONFIRMED BY SMEAR SPECIMEN CHECKED FOR CLOTS THIS CRITICAL RESULT HAS VERIFIED AND BEEN CALLED TO K.ZULETA BY NATHAN THOMPSON ON 07 06 2020 AT 1500, AND HAS BEEN READ BACK. CRITICAL RESULT VERIFIED    nRBC 0.5 (H) 0.0 - 0.2 %    Comment: Performed at [facility], [place] [address], [place], [place] [address]  Type and screen [place]     Status: None (Preliminary result)   Collection Time: 04/01/19  1:56 PM  Result Value Ref Range   ABO/RH(D) O POS    Antibody Screen NEG    Sample Expiration 04/04/2019,2359    Unit Number [id number]    Blood Component Type RED CELLS,LR    Unit division 00    Status of Unit ISSUED    Transfusion Status OK TO TRANSFUSE    Crossmatch Result Compatible    Unit Number [id number]    Blood Component Type RED CELLS,LR    Unit division 00    Status of Unit ISSUED    Transfusion Status OK TO TRANSFUSE    Crossmatch Result Compatible    Unit Number [id number]    Blood Component Type RED CELLS,LR    Unit division 00    Status of Unit ISSUED,FINAL    Transfusion Status OK TO TRANSFUSE    Crossmatch Result Compatible    Unit Number [id number]    Blood Component Type RED CELLS,LR    Unit division 00    Status of Unit ISSUED,FINAL    Transfusion Status OK TO TRANSFUSE    Crossmatch Result      Compatible Performed at [facility] [address], [place], [place] [address]   Reticulocytes     Status: Abnormal    Collection Time: 04/01/19  1:56 PM  Result Value Ref Range   Retic Ct Pct 0.6 0.4 - 3.1 %   RBC. 2.42 (L) 3.87 - 5.11 MIL/uL   Retic Count, Absolute 14.4 (L) 19.0 - 186.0 K/uL   Immature Retic Fract  32.7 (H) 2.3 - 15.9 %    Comment: Performed at [facility], [place] [address] [place], [place] [address]  I-Stat beta hCG blood, ED     Status: None   Collection Time: 04/01/19  2:07 PM  Result Value Ref Range   I-stat hCG, quantitative <5.0 <5 mIU/mL   Comment 3            Comment:   GEST. AGE      CONC.  (mIU/mL)   <=1 WEEK        5 - 50     2 WEEKS       50 - 500     3 WEEKS       100 - 10,000     4 WEEKS     1,000 - 30,000        FEMALE AND NON-PREGNANT FEMALE:     LESS THAN 5 mIU/mL   POC occult blood, ED     Status: None   Collection Time: 04/01/19  2:17 PM  Result Value Ref Range   Fecal Occult Bld NEGATIVE NEGATIVE  Prepare RBC     Status: None   Collection Time: 04/01/19  3:30 PM  Result Value Ref Range   Order Confirmation      ORDER PROCESSED BY BLOOD BANK Performed at [facility], [place] [address] [place], [place] [address]   Protime-INR     Status: None   Collection Time: 04/01/19  5:12 PM  Result Value Ref Range   Prothrombin Time 14.4 11.4 - 15.2 seconds   INR 1.1 0.8 - 1.2    Comment: (NOTE) INR goal varies based on device and disease states. Performed at [facility], [place] [address] [place], [place] [address]   PTT     Status: None   Collection Time: 04/01/19  5:12 PM  Result Value Ref Range   aPTT 29 24 - 36 seconds    Comment: Performed at [facility], [place] [address], [place], [place] [address]  Vitamin B12     Status: None   Collection Time: 04/01/19  5:12 PM  Result Value Ref Range   Vitamin B-12 280 180 - 914 pg/mL    Comment: (NOTE) This assay is not validated for testing neonatal or myeloproliferative syndrome specimens for Vitamin B12 levels. Performed at [facility], [place] [address] [place], [place] [address]   Folate     Status: None   Collection Time: 04/01/19  5:12 PM  Result Value Ref Range   Folate 11.1 >5.9 ng/mL    Comment: Performed at [facility], [place] [address] [place], [place] [address]  Iron and TIBC     Status: Abnormal   Collection Time: 04/01/19  5:12 PM  Result Value Ref Range   Iron 10 (L) 28 - 170 ug/dL   TIBC 522 (H) 250 - 450 ug/dL   Saturation Ratios 2 (L) 10.4 - 31.8 %   UIBC 512 ug/dL    Comment: Performed at [facility], [place] [address], [place], [place] [address]  Ferritin     Status: Abnormal   Collection Time: 04/01/19  5:12 PM  Result Value Ref Range   Ferritin 2 (L) 11 - 307 ng/mL    Comment: Performed at [facility], [place] [address] [place], [place] [address]  SARS Coronavirus 2 (CEPHEID - Performed in [facility] hospital lab), [facility]  Status: None   Collection Time: 04/01/19  5:12 PM   Specimen: Nasopharyngeal Swab  Result Value Ref Range   SARS Coronavirus 2 NEGATIVE NEGATIVE    Comment: (NOTE) If result is NEGATIVE SARS-CoV-2 target nucleic acids are NOT DETECTED. The SARS-CoV-2 RNA is generally detectable in upper and lower  respiratory specimens during the acute phase of infection. The lowest  concentration of SARS-CoV-2 viral copies this assay can detect is 250  copies / mL. A negative result does not preclude SARS-CoV-2 infection  and should not be used as the sole basis for treatment or other  patient management decisions.  A negative result may occur with  improper specimen collection / handling, submission of specimen other  than nasopharyngeal swab, presence of viral mutation(s) within the  areas targeted by this assay, and inadequate number of viral copies  (<250 copies / mL). A negative result must be combined with clinical  observations, patient history, and epidemiological information. If result is  POSITIVE SARS-CoV-2 target nucleic acids are DETECTED. The SARS-CoV-2 RNA is generally detectable in upper and lower  respiratory specimens dur ing the acute phase of infection.  Positive  results are indicative of active infection with SARS-CoV-2.  Clinical  correlation with patient history and other diagnostic information is  necessary to determine patient infection status.  Positive results do  not rule out bacterial infection or co-infection with other viruses. If result is PRESUMPTIVE POSTIVE SARS-CoV-2 nucleic acids MAY BE PRESENT.   A presumptive positive result was obtained on the submitted specimen  and confirmed on repeat testing.  While 2019 novel coronavirus  (SARS-CoV-2) nucleic acids may be present in the submitted sample  additional confirmatory testing may be necessary for epidemiological  and / or clinical management purposes  to differentiate between  SARS-CoV-2 and other Sarbecovirus currently known to infect humans.  If clinically indicated additional testing with an alternate test  methodology [phone]) is advised. The SARS-CoV-2 RNA is generally  detectable in upper and lower respiratory sp ecimens during the acute  phase of infection. The expected result is Negative. Fact Sheet for Patients:  StrictlyIdeas.no Fact Sheet for Healthcare Providers: BankingDealers.co.za This test is not yet approved or cleared by the [place] FDA and has been authorized for detection and/or diagnosis of SARS-CoV-2 by FDA under an Emergency Use Authorization (EUA).  This EUA will remain in effect (meaning this test can be used) for the duration of the COVID-19 declaration under Section 564(b)(1) of the Act, 21 U.S.C. section 360bbb-3(b)(1), unless the authorization is terminated or revoked sooner. Performed at [facility], [place] [address], [place], [place] [address]   MRSA PCR Screening     Status: None    Collection Time: 04/01/19 10:02 PM   Specimen: Nasal Mucosa; Nasopharyngeal  Result Value Ref Range   MRSA by PCR NEGATIVE NEGATIVE    Comment:        The GeneXpert MRSA Assay (FDA approved for NASAL specimens only), is one component of a comprehensive MRSA colonization surveillance program. It is not intended to diagnose MRSA infection nor to guide or monitor treatment for MRSA infections. Performed at [facility], [place] [address] [place], [place] [address]   Magnesium     Status: None   Collection Time: 04/02/19  2:25 AM  Result Value Ref Range   Magnesium 2.0 1.7 - 2.4 mg/dL    Comment: Performed at [facility], [place] [address], [place], [place] [address]  Phosphorus  Status: None   Collection Time: 04/02/19  2:25 AM  Result Value Ref Range   Phosphorus 3.6 2.5 - 4.6 mg/dL    Comment: Performed at [facility], [place] [address], [place], [place] [address]  TSH     Status: None   Collection Time: 04/02/19  2:25 AM  Result Value Ref Range   TSH 2.152 0.350 - 4.500 uIU/mL    Comment: Performed by a 3rd Generation assay with a functional sensitivity of <=0.01 uIU/mL. Performed at [facility], [place] [address], [place], [place] [address]   Comprehensive metabolic panel     Status: Abnormal   Collection Time: 04/02/19  2:25 AM  Result Value Ref Range   Sodium 136 135 - 145 mmol/L   Potassium 3.6 3.5 - 5.1 mmol/L   Chloride 107 98 - 111 mmol/L   CO2 23 22 - 32 mmol/L   Glucose, Bld 90 70 - 99 mg/dL   BUN 8 6 - 20 mg/dL   Creatinine, Ser 0.58 0.44 - 1.00 mg/dL   Calcium 8.7 (L) 8.9 - 10.3 mg/dL   Total Protein 6.9 6.5 - 8.1 g/dL   Albumin 3.8 3.5 - 5.0 g/dL   AST 14 (L) 15 - 41 U/L   ALT 7 0 - 44 U/L   Alkaline Phosphatase 31 (L) 38 - 126 U/L   Total Bilirubin 0.9 0.3 - 1.2 mg/dL   GFR calc non Af Amer >60 >60 mL/min   GFR calc Af Amer >60 >60 mL/min   Anion gap 6 5 - 15    Comment: Performed  at [facility], [place] [address] [place], [place] [address]  CBC     Status: Abnormal   Collection Time: 04/02/19  2:25 AM  Result Value Ref Range   WBC 8.6 4.0 - 10.5 K/uL   RBC 2.85 (L) 3.87 - 5.11 MIL/uL   Hemoglobin 5.8 (LL) 12.0 - 15.0 g/dL    Comment: REPEATED TO VERIFY POST TRANSFUSION SPECIMEN Reticulocyte Hemoglobin testing may be clinically indicated, consider ordering this additional test [id number] DELTA CHECK NOTED CRITICAL VALUE NOTED.  VALUE IS CONSISTENT WITH PREVIOUSLY REPORTED AND CALLED VALUE.    HCT 21.2 (L) 36.0 - 46.0 %   MCV 74.4 (L) 80.0 - 100.0 fL    Comment: POST TRANSFUSION SPECIMEN DELTA CHECK NOTED    MCH 20.4 (L) 26.0 - 34.0 pg   MCHC 27.4 (L) 30.0 - 36.0 g/dL   RDW Not Measured 11.5 - 15.5 %   Platelets 868 (H) 150 - 400 K/uL   nRBC 0.5 (H) 0.0 - 0.2 %    Comment: Performed at [facility], [place] [address] [place], [place] [address]  Hemoglobin and hematocrit, blood     Status: Abnormal   Collection Time: 04/02/19 10:30 AM  Result Value Ref Range   Hemoglobin 8.7 (L) 12.0 - 15.0 g/dL    Comment: REPEATED TO VERIFY POST TRANSFUSION SPECIMEN DELTA CHECK NOTED    HCT 28.6 (L) 36.0 - 46.0 %    Comment: Performed at [facility], [place] [address] [place],  [address]    Ct Abdomen Pelvis W Contrast  Result Date: 04/01/2019 CLINICAL DATA:  Abdominal pain with nausea and vomiting. Rectal bleeding EXAM: CT ABDOMEN AND PELVIS WITH CONTRAST TECHNIQUE: Multidetector CT imaging of the abdomen and pelvis was performed using the standard protocol following bolus administration of intravenous contrast. CONTRAST:  136mL OMNIPAQUE IOHEXOL 300 MG/ML  SOLN COMPARISON:  July 10, 2014 FINDINGS: Lower chest: There is stable scarring in the lateral left base. There is no lung base edema or consolidation. Hepatobiliary: There are innumerable cysts throughout the liver, also present on previous study. There  is a Riedel's lobe on the right, an anatomic variant. Pancreas: There is no evident pancreatic mass or inflammatory focus. Spleen: There is an apparent cyst near the dome of the spleen measuring 6 mm. No other splenic lesions evident. Adrenals/Urinary Tract: Adrenals bilaterally appear normal. Kidneys bilaterally show no evident mass or hydronephrosis on either side. Suspect prominent column of Bertin on the right, an anatomic variant. There is a 2 mm calculus in the lower pole of the right kidney. There is no evident ureteral calculus on either side. Urinary bladder is midline with wall thickness within normal limits. Stomach/Bowel: There is no appreciable bowel wall or mesenteric thickening. No evident bowel obstruction. Terminal ileum appears unremarkable. There is no free air or portal venous air. Vascular/Lymphatic: There is no abdominal aortic aneurysm. No vascular lesions are demonstrable on this study. There is no appreciable adenopathy in the abdomen or pelvis. Reproductive: The uterus is anteverted. The uterus is prominent and somewhat lobular in contour, likely indicative of leiomyomatous change. There is a 3.0 x 2.8 cm likely dominant follicle right ovary. Other: Appendix appears normal. No evident abscess or ascites in the abdomen or pelvis. Musculoskeletal: No blastic or lytic bone lesions. No intramuscular or abdominal wall lesions are evident. IMPRESSION: 1. 2 mm nonobstructing calculus lower pole left kidney. No hydronephrosis or ureteral calculus on either side. Urinary bladder wall thickness normal. Note that the areas of inhomogeneous enhancement in the kidneys noted on previous study are no longer evident. Enhancement is symmetric bilaterally currently. 2. No bowel obstruction. No abscess in the abdomen or pelvis. Appendix appears normal. 3. Apparent leiomyomatous uterus. Probable dominant follicle right ovary. 4.  Innumerable apparent liver cysts, a stable finding. Electronically Signed   By:  Lowella Grip III M.D.   On: 04/01/2019 17:52               Blood pressure (!) 149/86, pulse 64, temperature 98.5 F (36.9 C), temperature source Oral, resp. rate (!) 21, height 5\' 4"  (1.626 m), weight 84.6 kg, last menstrual period 03/25/2019, SpO2 100 %.  Physical exam:   General--Pleasant African-American female ENT--nonicteric Neck--supple with no lymphadenopathy Heart--regular rate and rhythm without murmurs or gallops Lungs--clear Abdomen--soft and nontender Psych--alert oriented and appropriate   Assessment: 1.  Hematochezia.  Sounds suspicious for diverticular bleed 2.  Profound anemia.  Probably due to vaginal bleeding along with possible colonic bleeding.  Plan: 1.  We will go ahead with colonoscopy tomorrow.  Have discussed with patient the possibility of polypectomy, biopsy and the possible risk of the procedure. 2.  Work-up of increased uterine bleeding has been started as well.   Nancy Fetter 04/02/2019, 12:07 PM   This note was created using voice recognition software and minor errors may Have occurred unintentionally. Pager: [phone] If no answer or after hours call [phone]   .jl

## 2019-04-02 NOTE — Progress Notes (Signed)
[facility]                                                                              Patient Demographics  Gabrielle Roberts, is a 48 y.o. female, DOB - 1970/10/01, OYD:[id number]  Admit date - 04/01/2019   Admitting Physician Toy Baker, MD  Outpatient Primary MD for the patient is Patient, No Pcp Per  Outpatient specialists:   LOS - 0  days   Medical records reviewed and are as summarized below:    Chief Complaint  Patient presents with   Abdominal Pain   Rectal Bleeding       Brief summary   Patient is a 48 year old female with history of chronic anemia, presented with nausea, vomiting and bright red rectal bleeding that started on the morning of admission.  Patient reported that she only had one episode of bright red blood in stool.  Denies any melanotic stools, hematemesis.  She did report mild LLQ abdominal cramping prior to the episode.  Patient had some shortness of breath, dyspnea on exertion, mild nausea and vomiting for the past few days.  Never had colonoscopy. For the past month she had been eating ice, dyspneic and having chest pain with exertion, dizziness but no syncope.  Hemoglobin 3.7 in ED  COVID-19 test negative  Assessment & Plan    Principal Problem:   Symptomatic anemia, acute on chronic blood loss anemia, lower GI bleed -Likely patient has underlying chronic anemia, presented with hemoglobin of 3.7, had one episode of BRBPR.  - States had some heavy menorrhagia last month in May, 4 days, but only 1 day on June 29, no current cycle. -Patient receiving 4 units packed RBCs -CT abdomen showed 2 mm nonobstructing calculus lower pole left kidney, no hydronephrosis, no bowel obstruction, apparently a myomatous uterus. -[facility] has been consulted, continue transfusion, never had colonoscopy -Also ordered abdominal and transvaginal ultrasound to assess for uterine fibroids -Anemia panel showed severe iron deficiency anemia, will add  1 dose of Feraheme  Active Problems:    Elevated blood pressure reading -No known history of hypertension, currently receiving transfusion    Thrombocytosis (HCC) -In the setting of severe anemia, mostly reactive  Code Status: Full code DVT Prophylaxis: SCDs Family Communication: Discussed in detail with the patient, all imaging results, lab results explained to the patient    Disposition Plan: Currently receiving packed RBC transfusion, work-up in progress, needs GI work-up.  Time Spent in minutes   35 minutes  Procedures:  None  Consultants:   Gastroenterology, [facility]  Antimicrobials:   Anti-infectives (From admission, onward)   None         Medications  Scheduled Meds:  sodium chloride   Intravenous Once   Chlorhexidine Gluconate Cloth  6 each Topical Daily   Continuous Infusions: PRN Meds:.acetaminophen **OR** acetaminophen, HYDROcodone-acetaminophen, ondansetron **OR** ondansetron (ZOFRAN) IV      Subjective:   Gabrielle Roberts was seen and examined today.  Has not ambulated.  Denies any chest pain, palpitations.  Patient denies  shortness of breath, abdominal pain, N/V/D/C, new weakness, numbess, tingling.  Receiving blood transfusion.  Objective:   Vitals:  04/02/19 0530 04/02/19 0559 04/02/19 0754 04/02/19 0900  BP: 135/90 136/73 (!) 152/92 (!) 149/86  Pulse: 63 62 61 64  Resp: 17 20 (!) 25 (!) 21  Temp: 98.4 F (36.9 C) 98.8 F (37.1 C) 98.5 F (36.9 C)   TempSrc: Oral Oral Oral   SpO2: 99% 99% 100% 100%  Weight:      Height:        Intake/Output Summary (Last 24 hours) at 04/02/2019 0941 Last data filed at 04/02/2019 0752 Gross per 24 hour  Intake 1494.34 ml  Output --  Net 1494.34 ml     Wt Readings from Last 3 Encounters:  04/01/19 84.6 kg  10/17/17 90.7 kg  05/22/15 92.5 kg     Exam  General: Alert and oriented x 3, NAD  Eyes: Pale conjunctiva  HEENT:  Atraumatic, normocephalic,  Cardiovascular: S1 S2 auscultated,  Regular rate and rhythm.  Respiratory: Clear to auscultation bilaterally, no wheezing, rales or rhonchi  Gastrointestinal: Soft, nontender, nondistended, + bowel sounds  Ext: no pedal edema bilaterally  Neuro: No new deficits  Musculoskeletal: No digital cyanosis, clubbing  Skin: No rashes  Psych: Normal affect and demeanor, alert and oriented x3    Data Reviewed:  I have personally reviewed following labs and imaging studies  Micro Results Recent Results (from the past 240 hour(s))  SARS Coronavirus 2 (CEPHEID - Performed in [facility] lab), Hosp Order     Status: None   Collection Time: 04/01/19  5:12 PM   Specimen: Nasopharyngeal Swab  Result Value Ref Range Status   SARS Coronavirus 2 NEGATIVE NEGATIVE Final    Comment: (NOTE) If result is NEGATIVE SARS-CoV-2 target nucleic acids are NOT DETECTED. The SARS-CoV-2 RNA is generally detectable in upper and lower  respiratory specimens during the acute phase of infection. The lowest  concentration of SARS-CoV-2 viral copies this assay can detect is 250  copies / mL. A negative result does not preclude SARS-CoV-2 infection  and should not be used as the sole basis for treatment or other  patient management decisions.  A negative result may occur with  improper specimen collection / handling, submission of specimen other  than nasopharyngeal swab, presence of viral mutation(s) within the  areas targeted by this assay, and inadequate number of viral copies  (<250 copies / mL). A negative result must be combined with clinical  observations, patient history, and epidemiological information. If result is POSITIVE SARS-CoV-2 target nucleic acids are DETECTED. The SARS-CoV-2 RNA is generally detectable in upper and lower  respiratory specimens dur ing the acute phase of infection.  Positive  results are indicative of active infection with SARS-CoV-2.  Clinical  correlation with patient history and other diagnostic  information is  necessary to determine patient infection status.  Positive results do  not rule out bacterial infection or co-infection with other viruses. If result is PRESUMPTIVE POSTIVE SARS-CoV-2 nucleic acids MAY BE PRESENT.   A presumptive positive result was obtained on the submitted specimen  and confirmed on repeat testing.  While 2019 novel coronavirus  (SARS-CoV-2) nucleic acids may be present in the submitted sample  additional confirmatory testing may be necessary for epidemiological  and / or clinical management purposes  to differentiate between  SARS-CoV-2 and other Sarbecovirus currently known to infect humans.  If clinically indicated additional testing with an alternate test  methodology [id number]) is advised. The SARS-CoV-2 RNA is generally  detectable in upper and lower respiratory sp ecimens during the acute  phase  of infection. The expected result is Negative. Fact Sheet for Patients:  StrictlyIdeas.no Fact Sheet for Healthcare Providers: BankingDealers.co.za This test is not yet approved or cleared by the [place] FDA and has been authorized for detection and/or diagnosis of SARS-CoV-2 by FDA under an Emergency Use Authorization (EUA).  This EUA will remain in effect (meaning this test can be used) for the duration of the COVID-19 declaration under Section 564(b)(1) of the Act, 21 U.S.C. section 360bbb-3(b)(1), unless the authorization is terminated or revoked sooner. Performed at [facility], [place] [address] [place], [place] [address]   MRSA PCR Screening     Status: None   Collection Time: 04/01/19 10:02 PM   Specimen: Nasal Mucosa; Nasopharyngeal  Result Value Ref Range Status   MRSA by PCR NEGATIVE NEGATIVE Final    Comment:        The GeneXpert MRSA Assay (FDA approved for NASAL specimens only), is one component of a comprehensive MRSA colonization surveillance program. It is  not intended to diagnose MRSA infection nor to guide or monitor treatment for MRSA infections. Performed at [facility], [place] [address] [place], [place] [address]     Radiology Reports Ct Abdomen Pelvis W Contrast  Result Date: 04/01/2019 CLINICAL DATA:  Abdominal pain with nausea and vomiting. Rectal bleeding EXAM: CT ABDOMEN AND PELVIS WITH CONTRAST TECHNIQUE: Multidetector CT imaging of the abdomen and pelvis was performed using the standard protocol following bolus administration of intravenous contrast. CONTRAST:  130mL OMNIPAQUE IOHEXOL 300 MG/ML  SOLN COMPARISON:  July 10, 2014 FINDINGS: Lower chest: There is stable scarring in the lateral left base. There is no lung base edema or consolidation. Hepatobiliary: There are innumerable cysts throughout the liver, also present on previous study. There is a Riedel's lobe on the right, an anatomic variant. Pancreas: There is no evident pancreatic mass or inflammatory focus. Spleen: There is an apparent cyst near the dome of the spleen measuring 6 mm. No other splenic lesions evident. Adrenals/Urinary Tract: Adrenals bilaterally appear normal. Kidneys bilaterally show no evident mass or hydronephrosis on either side. Suspect prominent column of Bertin on the right, an anatomic variant. There is a 2 mm calculus in the lower pole of the right kidney. There is no evident ureteral calculus on either side. Urinary bladder is midline with wall thickness within normal limits. Stomach/Bowel: There is no appreciable bowel wall or mesenteric thickening. No evident bowel obstruction. Terminal ileum appears unremarkable. There is no free air or portal venous air. Vascular/Lymphatic: There is no abdominal aortic aneurysm. No vascular lesions are demonstrable on this study. There is no appreciable adenopathy in the abdomen or pelvis. Reproductive: The uterus is anteverted. The uterus is prominent and somewhat lobular in contour, likely indicative  of leiomyomatous change. There is a 3.0 x 2.8 cm likely dominant follicle right ovary. Other: Appendix appears normal. No evident abscess or ascites in the abdomen or pelvis. Musculoskeletal: No blastic or lytic bone lesions. No intramuscular or abdominal wall lesions are evident. IMPRESSION: 1. 2 mm nonobstructing calculus lower pole left kidney. No hydronephrosis or ureteral calculus on either side. Urinary bladder wall thickness normal. Note that the areas of inhomogeneous enhancement in the kidneys noted on previous study are no longer evident. Enhancement is symmetric bilaterally currently. 2. No bowel obstruction. No abscess in the abdomen or pelvis. Appendix appears normal. 3. Apparent leiomyomatous uterus. Probable dominant follicle right ovary. 4.  Innumerable apparent liver cysts, a stable finding. Electronically Signed   By:  Lowella Grip III M.D.   On: 04/01/2019 17:52    Lab Data:  CBC: Recent Labs  Lab 04/01/19 1356 04/02/19 0225  WBC 7.4 8.6  HGB 3.7* 5.8*  HCT 15.6* 21.2*  MCV 64.5* 74.4*  PLT 1,063* 546*   Basic Metabolic Panel: Recent Labs  Lab 04/01/19 1356 04/02/19 0225  NA 140 136  K 3.6 3.6  CL 107 107  CO2 25 23  GLUCOSE 93 90  BUN 9 8  CREATININE 0.76 0.58  CALCIUM 9.3 8.7*  MG  --  2.0  PHOS  --  3.6   GFR: Estimated Creatinine Clearance: 92.5 mL/min (by C-G formula based on SCr of 0.58 mg/dL). Liver Function Tests: Recent Labs  Lab 04/01/19 1356 04/02/19 0225  AST 17 14*  ALT 8 7  ALKPHOS 33* 31*  BILITOT 0.2* 0.9  PROT 8.0 6.9  ALBUMIN 4.4 3.8   No results for input(s): LIPASE, AMYLASE in the last 168 hours. No results for input(s): AMMONIA in the last 168 hours. Coagulation Profile: Recent Labs  Lab 04/01/19 1712  INR 1.1   Cardiac Enzymes: No results for input(s): CKTOTAL, CKMB, CKMBINDEX, TROPONINI in the last 168 hours. BNP (last 3 results) No results for input(s): PROBNP in the last 8760 hours. HbA1C: No results for  input(s): HGBA1C in the last 72 hours. CBG: No results for input(s): GLUCAP in the last 168 hours. Lipid Profile: No results for input(s): CHOL, HDL, LDLCALC, TRIG, CHOLHDL, LDLDIRECT in the last 72 hours. Thyroid Function Tests: Recent Labs    04/02/19 0225  TSH 2.152   Anemia Panel: Recent Labs    04/01/19 1356 04/01/19 1712  VITAMINB12  --  280  FOLATE  --  11.1  FERRITIN  --  2*  TIBC  --  522*  IRON  --  10*  RETICCTPCT 0.6  --    Urine analysis:    Component Value Date/Time   COLORURINE YELLOW 07/10/2014 1152   APPEARANCEUR CLOUDY (A) 07/10/2014 1152   LABSPEC 1.023 07/10/2014 1152   PHURINE 5.5 07/10/2014 1152   GLUCOSEU NEGATIVE 07/10/2014 1152   HGBUR LARGE (A) 07/10/2014 1152   BILIRUBINUR NEGATIVE 07/10/2014 1152   KETONESUR 15 (A) 07/10/2014 1152   PROTEINUR 100 (A) 07/10/2014 1152   UROBILINOGEN 0.2 07/10/2014 1152   NITRITE NEGATIVE 07/10/2014 1152   LEUKOCYTESUR MODERATE (A) 07/10/2014 1152     Jaselynn Tamas M.D. [facility] 04/02/2019, 9:41 AM  Pager: [phone] Between 7am to 7pm - call Pager - [phone]  After 7pm go to [email or web address] - password [email or web address]  Call night coverage person covering after 7pm

## 2019-04-02 NOTE — Consult Note (Signed)
[facility] CONS[facility] Reason for consult: Lower GI bleed with anemia Referring Physician: [facility] hospitalist.  PCP: None primary GI: None  Gabrielle Roberts is an 48 y.o. female.  HPI: 48 year old woman who has had issues with increased vaginal bleeding and anemia.  She notes that on the day of admission she had some nausea and vomiting but this was clearish material.  She had bright red blood yesterday with her bowel movements has had some cramping.  This all feels much better today.  She has had no melena or hematemesis had some mild left lower quadrant pain.  No prior colonoscopy no family history of colon cancer she has been short of breath and fatigue lately.  Her hemoglobin was 3.7.  She has been transfused and hemoglobin is back up to 8.7 with transfusion.  She feels much better.  Past Medical History:  Diagnosis Date  . Anemia     Past Surgical History:  Procedure Laterality Date  . ORIF ZYGOMATIC FRACTURE  01/27/2012   Procedure: OPEN REDUCTION INTERNAL FIXATION (ORIF) ZYGOMATIC FRACTURE;  Surgeon: Ascencion Dike, MD;  Location: [facility];  Service: ENT;  Laterality: Right;  repair left lip laceration    Family History  Problem Relation Age of Onset  . Diabetes Mother   . Hypertension Mother     Social History:  reports that she has never smoked. She has never used smokeless tobacco. She reports current alcohol use. She reports that she does not use drugs.  Allergies: No Known Allergies  Medications; Prior to Admission medications   Medication Sig Start Date End Date Taking? Authorizing Provider  cyclobenzaprine (FLEXERIL) 10 MG tablet Take 1 tablet (10 mg total) by mouth 2 (two) times daily as needed for muscle spasms. Patient not taking: Reported on 04/01/2019 01/10/17   McDonald, Maree Erie A, PA-C  HYDROcodone-acetaminophen (NORCO/VICODIN) 5-325 MG tablet Take 2 tablets by mouth every 4 (four) hours as needed. Patient not taking: Reported on 04/01/2019 10/17/17   Lacretia Leigh, MD   ibuprofen (ADVIL,MOTRIN) 600 MG tablet Take 1 tablet (600 mg total) by mouth every 6 (six) hours as needed. Patient not taking: Reported on 04/01/2019 10/17/17   Lacretia Leigh, MD  methocarbamol (ROBAXIN-750) 750 MG tablet Take 1 tablet (750 mg total) by mouth 4 (four) times daily. Patient not taking: Reported on 04/01/2019 10/17/17   Lacretia Leigh, MD   . sodium chloride   Intravenous Once  . Chlorhexidine Gluconate Cloth  6 each Topical Daily  . magnesium hydroxide  30 mL Oral Once   PRN Meds acetaminophen **OR** acetaminophen, HYDROcodone-acetaminophen, ondansetron **OR** ondansetron (ZOFRAN) IV Results for orders placed or performed during the hospital encounter of 04/01/19 (from the past 48 hour(s))  ABO/Rh     Status: None   Collection Time: 04/01/19  1:55 PM  Result Value Ref Range   ABO/RH(D)      O POS Performed at [facility] [address] [place], [place] [address]   Comprehensive metabolic panel     Status: Abnormal   Collection Time: 04/01/19  1:56 PM  Result Value Ref Range   Sodium 140 135 - 145 mmol/L   Potassium 3.6 3.5 - 5.1 mmol/L   Chloride 107 98 - 111 mmol/L   CO2 25 22 - 32 mmol/L   Glucose, Bld 93 70 - 99 mg/dL   BUN 9 6 - 20 mg/dL   Creatinine, Ser 0.76 0.44 - 1.00 mg/dL   Calcium 9.3 8.9 - 10.3 mg/dL   Total  Protein 8.0 6.5 - 8.1 g/dL   Albumin 4.4 3.5 - 5.0 g/dL   AST 17 15 - 41 U/L   ALT 8 0 - 44 U/L   Alkaline Phosphatase 33 (L) 38 - 126 U/L   Total Bilirubin 0.2 (L) 0.3 - 1.2 mg/dL   GFR calc non Af Amer >60 >60 mL/min   GFR calc Af Amer >60 >60 mL/min   Anion gap 8 5 - 15    Comment: Performed at [facility], [place] [address], [place], [place] [address]  CBC     Status: Abnormal   Collection Time: 04/01/19  1:56 PM  Result Value Ref Range   WBC 7.4 4.0 - 10.5 K/uL   RBC 2.42 (L) 3.87 - 5.11 MIL/uL   Hemoglobin 3.7 (LL) 12.0 - 15.0 g/dL    Comment: REPEATED TO VERIFY Reticulocyte Hemoglobin  testing may be clinically indicated, consider ordering this additional test [id number] THIS CRITICAL RESULT HAS VERIFIED AND BEEN CALLED TO K.ZULETA BY NATHAN THOMPSON ON 07 06 2020 AT 1500, AND HAS BEEN READ BACK. CRITICAL RESULT VERIFIED    HCT 15.6 (L) 36.0 - 46.0 %   MCV 64.5 (L) 80.0 - 100.0 fL   MCH 15.3 (L) 26.0 - 34.0 pg   MCHC 23.7 (L) 30.0 - 36.0 g/dL   RDW 36.2 (H) 11.5 - 15.5 %   Platelets 1,063 (HH) 150 - 400 K/uL    Comment: REPEATED TO VERIFY PLATELET COUNT CONFIRMED BY SMEAR SPECIMEN CHECKED FOR CLOTS THIS CRITICAL RESULT HAS VERIFIED AND BEEN CALLED TO K.ZULETA BY NATHAN THOMPSON ON 07 06 2020 AT 1500, AND HAS BEEN READ BACK. CRITICAL RESULT VERIFIED    nRBC 0.5 (H) 0.0 - 0.2 %    Comment: Performed at [facility], [place] [address] [place], [place] [address]  Type and screen [place]     Status: None (Preliminary result)   Collection Time: 04/01/19  1:56 PM  Result Value Ref Range   ABO/RH(D) O POS    Antibody Screen NEG    Sample Expiration 04/04/2019,2359    Unit Number [id number]    Blood Component Type RED CELLS,LR    Unit division 00    Status of Unit ISSUED    Transfusion Status OK TO TRANSFUSE    Crossmatch Result Compatible    Unit Number [id number]    Blood Component Type RED CELLS,LR    Unit division 00    Status of Unit ISSUED    Transfusion Status OK TO TRANSFUSE    Crossmatch Result Compatible    Unit Number [id number]    Blood Component Type RED CELLS,LR    Unit division 00    Status of Unit ISSUED,FINAL    Transfusion Status OK TO TRANSFUSE    Crossmatch Result Compatible    Unit Number [id number]    Blood Component Type RED CELLS,LR    Unit division 00    Status of Unit ISSUED,FINAL    Transfusion Status OK TO TRANSFUSE    Crossmatch Result      Compatible Performed at [facility] [address], [place], [place] [address]   Reticulocytes     Status: Abnormal    Collection Time: 04/01/19  1:56 PM  Result Value Ref Range   Retic Ct Pct 0.6 0.4 - 3.1 %   RBC. 2.42 (L) 3.87 - 5.11 MIL/uL   Retic Count, Absolute 14.4 (L) 19.0 - 186.0 K/uL   Immature Retic Fract  32.7 (H) 2.3 - 15.9 %    Comment: Performed at [facility], [place] [address], [place], [place] [address]  I-Stat beta hCG blood, ED     Status: None   Collection Time: 04/01/19  2:07 PM  Result Value Ref Range   I-stat hCG, quantitative <5.0 <5 mIU/mL   Comment 3            Comment:   GEST. AGE      CONC.  (mIU/mL)   <=1 WEEK        5 - 50     2 WEEKS       50 - 500     3 WEEKS       100 - 10,000     4 WEEKS     1,000 - 30,000        FEMALE AND NON-PREGNANT FEMALE:     LESS THAN 5 mIU/mL   POC occult blood, ED     Status: None   Collection Time: 04/01/19  2:17 PM  Result Value Ref Range   Fecal Occult Bld NEGATIVE NEGATIVE  Prepare RBC     Status: None   Collection Time: 04/01/19  3:30 PM  Result Value Ref Range   Order Confirmation      ORDER PROCESSED BY BLOOD BANK Performed at [facility], [place] [address], [place], [place] [address]   Protime-INR     Status: None   Collection Time: 04/01/19  5:12 PM  Result Value Ref Range   Prothrombin Time 14.4 11.4 - 15.2 seconds   INR 1.1 0.8 - 1.2    Comment: (NOTE) INR goal varies based on device and disease states. Performed at [facility], [place] [address] [place], [place] [address]   PTT     Status: None   Collection Time: 04/01/19  5:12 PM  Result Value Ref Range   aPTT 29 24 - 36 seconds    Comment: Performed at [facility], [place] [address] [place], [place] [address]  Vitamin B12     Status: None   Collection Time: 04/01/19  5:12 PM  Result Value Ref Range   Vitamin B-12 280 180 - 914 pg/mL    Comment: (NOTE) This assay is not validated for testing neonatal or myeloproliferative syndrome specimens for Vitamin B12 levels. Performed at [facility], [place] [address] [place], [place] [address]   Folate     Status: None   Collection Time: 04/01/19  5:12 PM  Result Value Ref Range   Folate 11.1 >5.9 ng/mL    Comment: Performed at [facility], [place] [address] [place], [place] [address]  Iron and TIBC     Status: Abnormal   Collection Time: 04/01/19  5:12 PM  Result Value Ref Range   Iron 10 (L) 28 - 170 ug/dL   TIBC 522 (H) 250 - 450 ug/dL   Saturation Ratios 2 (L) 10.4 - 31.8 %   UIBC 512 ug/dL    Comment: Performed at [facility], [place] [address], [place], [place] [address]  Ferritin     Status: Abnormal   Collection Time: 04/01/19  5:12 PM  Result Value Ref Range   Ferritin 2 (L) 11 - 307 ng/mL    Comment: Performed at [facility], [place] [address], [place], [place] [address]  SARS Coronavirus 2 (CEPHEID - Performed in [facility] hospital lab), [facility]  Status: None   Collection Time: 04/01/19  5:12 PM   Specimen: Nasopharyngeal Swab  Result Value Ref Range   SARS Coronavirus 2 NEGATIVE NEGATIVE    Comment: (NOTE) If result is NEGATIVE SARS-CoV-2 target nucleic acids are NOT DETECTED. The SARS-CoV-2 RNA is generally detectable in upper and lower  respiratory specimens during the acute phase of infection. The lowest  concentration of SARS-CoV-2 viral copies this assay can detect is 250  copies / mL. A negative result does not preclude SARS-CoV-2 infection  and should not be used as the sole basis for treatment or other  patient management decisions.  A negative result may occur with  improper specimen collection / handling, submission of specimen other  than nasopharyngeal swab, presence of viral mutation(s) within the  areas targeted by this assay, and inadequate number of viral copies  (<250 copies / mL). A negative result must be combined with clinical  observations, patient history, and epidemiological information. If result is  POSITIVE SARS-CoV-2 target nucleic acids are DETECTED. The SARS-CoV-2 RNA is generally detectable in upper and lower  respiratory specimens dur ing the acute phase of infection.  Positive  results are indicative of active infection with SARS-CoV-2.  Clinical  correlation with patient history and other diagnostic information is  necessary to determine patient infection status.  Positive results do  not rule out bacterial infection or co-infection with other viruses. If result is PRESUMPTIVE POSTIVE SARS-CoV-2 nucleic acids MAY BE PRESENT.   A presumptive positive result was obtained on the submitted specimen  and confirmed on repeat testing.  While 2019 novel coronavirus  (SARS-CoV-2) nucleic acids may be present in the submitted sample  additional confirmatory testing may be necessary for epidemiological  and / or clinical management purposes  to differentiate between  SARS-CoV-2 and other Sarbecovirus currently known to infect humans.  If clinically indicated additional testing with an alternate test  methodology [phone]) is advised. The SARS-CoV-2 RNA is generally  detectable in upper and lower respiratory sp ecimens during the acute  phase of infection. The expected result is Negative. Fact Sheet for Patients:  StrictlyIdeas.no Fact Sheet for Healthcare Providers: BankingDealers.co.za This test is not yet approved or cleared by the [place] FDA and has been authorized for detection and/or diagnosis of SARS-CoV-2 by FDA under an Emergency Use Authorization (EUA).  This EUA will remain in effect (meaning this test can be used) for the duration of the COVID-19 declaration under Section 564(b)(1) of the Act, 21 U.S.C. section 360bbb-3(b)(1), unless the authorization is terminated or revoked sooner. Performed at [facility], [place] [address], [place], [place] [address]   MRSA PCR Screening     Status: None    Collection Time: 04/01/19 10:02 PM   Specimen: Nasal Mucosa; Nasopharyngeal  Result Value Ref Range   MRSA by PCR NEGATIVE NEGATIVE    Comment:        The GeneXpert MRSA Assay (FDA approved for NASAL specimens only), is one component of a comprehensive MRSA colonization surveillance program. It is not intended to diagnose MRSA infection nor to guide or monitor treatment for MRSA infections. Performed at [facility], [place] [address], [place], [place] [address]   Magnesium     Status: None   Collection Time: 04/02/19  2:25 AM  Result Value Ref Range   Magnesium 2.0 1.7 - 2.4 mg/dL    Comment: Performed at [facility], [place] [address] [place], [place] [address]  Phosphorus  Status: None   Collection Time: 04/02/19  2:25 AM  Result Value Ref Range   Phosphorus 3.6 2.5 - 4.6 mg/dL    Comment: Performed at [facility], [place] [address], [place], [place] [address]  TSH     Status: None   Collection Time: 04/02/19  2:25 AM  Result Value Ref Range   TSH 2.152 0.350 - 4.500 uIU/mL    Comment: Performed by a 3rd Generation assay with a functional sensitivity of <=0.01 uIU/mL. Performed at [facility], [place] [address], [place], [place] [address]   Comprehensive metabolic panel     Status: Abnormal   Collection Time: 04/02/19  2:25 AM  Result Value Ref Range   Sodium 136 135 - 145 mmol/L   Potassium 3.6 3.5 - 5.1 mmol/L   Chloride 107 98 - 111 mmol/L   CO2 23 22 - 32 mmol/L   Glucose, Bld 90 70 - 99 mg/dL   BUN 8 6 - 20 mg/dL   Creatinine, Ser 0.58 0.44 - 1.00 mg/dL   Calcium 8.7 (L) 8.9 - 10.3 mg/dL   Total Protein 6.9 6.5 - 8.1 g/dL   Albumin 3.8 3.5 - 5.0 g/dL   AST 14 (L) 15 - 41 U/L   ALT 7 0 - 44 U/L   Alkaline Phosphatase 31 (L) 38 - 126 U/L   Total Bilirubin 0.9 0.3 - 1.2 mg/dL   GFR calc non Af Amer >60 >60 mL/min   GFR calc Af Amer >60 >60 mL/min   Anion gap 6 5 - 15    Comment: Performed  at [facility], [place] [address], [place], [place] [address]  CBC     Status: Abnormal   Collection Time: 04/02/19  2:25 AM  Result Value Ref Range   WBC 8.6 4.0 - 10.5 K/uL   RBC 2.85 (L) 3.87 - 5.11 MIL/uL   Hemoglobin 5.8 (LL) 12.0 - 15.0 g/dL    Comment: REPEATED TO VERIFY POST TRANSFUSION SPECIMEN Reticulocyte Hemoglobin testing may be clinically indicated, consider ordering this additional test [id number] DELTA CHECK NOTED CRITICAL VALUE NOTED.  VALUE IS CONSISTENT WITH PREVIOUSLY REPORTED AND CALLED VALUE.    HCT 21.2 (L) 36.0 - 46.0 %   MCV 74.4 (L) 80.0 - 100.0 fL    Comment: POST TRANSFUSION SPECIMEN DELTA CHECK NOTED    MCH 20.4 (L) 26.0 - 34.0 pg   MCHC 27.4 (L) 30.0 - 36.0 g/dL   RDW Not Measured 11.5 - 15.5 %   Platelets 868 (H) 150 - 400 K/uL   nRBC 0.5 (H) 0.0 - 0.2 %    Comment: Performed at [facility], [place] [address], [place], [place] [address]  Hemoglobin and hematocrit, blood     Status: Abnormal   Collection Time: 04/02/19 10:30 AM  Result Value Ref Range   Hemoglobin 8.7 (L) 12.0 - 15.0 g/dL    Comment: REPEATED TO VERIFY POST TRANSFUSION SPECIMEN DELTA CHECK NOTED    HCT 28.6 (L) 36.0 - 46.0 %    Comment: Performed at [facility], [place] [address] [place],  [address]    Ct Abdomen Pelvis W Contrast  Result Date: 04/01/2019 CLINICAL DATA:  Abdominal pain with nausea and vomiting. Rectal bleeding EXAM: CT ABDOMEN AND PELVIS WITH CONTRAST TECHNIQUE: Multidetector CT imaging of the abdomen and pelvis was performed using the standard protocol following bolus administration of intravenous contrast. CONTRAST:  135mL OMNIPAQUE IOHEXOL 300 MG/ML  SOLN COMPARISON:  July 10, 2014 FINDINGS: Lower chest: There is stable scarring in the lateral left base. There is no lung base edema or consolidation. Hepatobiliary: There are innumerable cysts throughout the liver, also present on previous study. There  is a Riedel's lobe on the right, an anatomic variant. Pancreas: There is no evident pancreatic mass or inflammatory focus. Spleen: There is an apparent cyst near the dome of the spleen measuring 6 mm. No other splenic lesions evident. Adrenals/Urinary Tract: Adrenals bilaterally appear normal. Kidneys bilaterally show no evident mass or hydronephrosis on either side. Suspect prominent column of Bertin on the right, an anatomic variant. There is a 2 mm calculus in the lower pole of the right kidney. There is no evident ureteral calculus on either side. Urinary bladder is midline with wall thickness within normal limits. Stomach/Bowel: There is no appreciable bowel wall or mesenteric thickening. No evident bowel obstruction. Terminal ileum appears unremarkable. There is no free air or portal venous air. Vascular/Lymphatic: There is no abdominal aortic aneurysm. No vascular lesions are demonstrable on this study. There is no appreciable adenopathy in the abdomen or pelvis. Reproductive: The uterus is anteverted. The uterus is prominent and somewhat lobular in contour, likely indicative of leiomyomatous change. There is a 3.0 x 2.8 cm likely dominant follicle right ovary. Other: Appendix appears normal. No evident abscess or ascites in the abdomen or pelvis. Musculoskeletal: No blastic or lytic bone lesions. No intramuscular or abdominal wall lesions are evident. IMPRESSION: 1. 2 mm nonobstructing calculus lower pole left kidney. No hydronephrosis or ureteral calculus on either side. Urinary bladder wall thickness normal. Note that the areas of inhomogeneous enhancement in the kidneys noted on previous study are no longer evident. Enhancement is symmetric bilaterally currently. 2. No bowel obstruction. No abscess in the abdomen or pelvis. Appendix appears normal. 3. Apparent leiomyomatous uterus. Probable dominant follicle right ovary. 4.  Innumerable apparent liver cysts, a stable finding. Electronically Signed   By:  Lowella Grip III M.D.   On: 04/01/2019 17:52               Blood pressure (!) 149/86, pulse 64, temperature 98.5 F (36.9 C), temperature source Oral, resp. rate (!) 21, height 5\' 4"  (1.626 m), weight 84.6 kg, last menstrual period 03/25/2019, SpO2 100 %.  Physical exam:   General--Pleasant African-American female ENT--nonicteric Neck--supple with no lymphadenopathy Heart--regular rate and rhythm without murmurs or gallops Lungs--clear Abdomen--soft and nontender Psych--alert oriented and appropriate   Assessment: 1.  Hematochezia.  Sounds suspicious for diverticular bleed 2.  Profound anemia.  Probably due to vaginal bleeding along with possible colonic bleeding.  Plan: 1.  We will go ahead with colonoscopy tomorrow.  Have discussed with patient the possibility of polypectomy, biopsy and the possible risk of the procedure. 2.  Work-up of increased uterine bleeding has been started as well.   Nancy Fetter 04/02/2019, 12:07 PM   This note was created using voice recognition software and minor errors may Have occurred unintentionally. Pager: [phone] If no answer or after hours call [phone]   .jl

## 2019-04-03 ENCOUNTER — Observation Stay (HOSPITAL_COMMUNITY): Payer: Commercial Managed Care - PPO | Admitting: Anesthesiology

## 2019-04-03 ENCOUNTER — Encounter (HOSPITAL_COMMUNITY)
Admission: EM | Disposition: A | Discharge status: Home / Self Care | Payer: Self-pay | Source: Home / Self Care | Attending: Emergency Medicine

## 2019-04-03 ENCOUNTER — Encounter (HOSPITAL_COMMUNITY): Payer: Self-pay | Admitting: Anesthesiology

## 2019-04-03 DIAGNOSIS — D649 Anemia, unspecified: Secondary | ICD-10-CM | POA: Diagnosis not present

## 2019-04-03 HISTORY — PX: COLONOSCOPY WITH PROPOFOL: SHX5780

## 2019-04-03 LAB — BASIC METABOLIC PANEL
Anion gap: 8 (ref 5–15)
BUN: 6 mg/dL (ref 6–20)
CO2: 23 mmol/L (ref 22–32)
Calcium: 9.3 mg/dL (ref 8.9–10.3)
Chloride: 107 mmol/L (ref 98–111)
Creatinine, Ser: 0.65 mg/dL (ref 0.44–1.00)
GFR calc Af Amer: >60 mL/min (ref >60)
GFR calc non Af Amer: >60 mL/min (ref >60)
Glucose, Bld: 83 mg/dL (ref 70–99)
Potassium: 3.6 mmol/L (ref 3.5–5.1)
Sodium: 138 mmol/L (ref 135–145)

## 2019-04-03 LAB — BPAM RBC
Blood Product Expiration Date: 202008032359
Blood Product Expiration Date: 202008032359
Blood Product Expiration Date: 202008032359
Blood Product Expiration Date: 202008032359
ISSUE DATE / TIME: 202007061737
ISSUE DATE / TIME: 202007062208
ISSUE DATE / TIME: 202007070323
ISSUE DATE / TIME: 202007070539
Unit Type and Rh: 5100
Unit Type and Rh: 5100
Unit Type and Rh: 5100
Unit Type and Rh: 5100

## 2019-04-03 LAB — CBC
HCT: 28.3 % — ABNORMAL LOW (ref 36.0–46.0)
Hemoglobin: 8.5 g/dL — ABNORMAL LOW (ref 12.0–15.0)
MCH: 23.4 pg — ABNORMAL LOW (ref 26.0–34.0)
MCHC: 30 g/dL (ref 30.0–36.0)
MCV: 78 fL — ABNORMAL LOW (ref 80.0–100.0)
Platelets: 589 10*3/uL — ABNORMAL HIGH (ref 150–400)
RBC: 3.63 MIL/uL — ABNORMAL LOW (ref 3.87–5.11)
WBC: 8.8 10*3/uL (ref 4.0–10.5)
nRBC: 0.9 % — ABNORMAL HIGH (ref 0.0–0.2)

## 2019-04-03 LAB — TYPE AND SCREEN
ABO/RH(D): O POS
Antibody Screen: NEGATIVE
Unit division: 0
Unit division: 0
Unit division: 0
Unit division: 0

## 2019-04-03 SURGERY — COLONOSCOPY WITH PROPOFOL
Anesthesia: Monitor Anesthesia Care

## 2019-04-03 MED ORDER — GLYCOPYRROLATE 0.2 MG/ML IJ SOLN
INTRAMUSCULAR | Status: DC | PRN
  Administered 2019-04-03: 0.2 mg via INTRAVENOUS

## 2019-04-03 MED ORDER — MIDAZOLAM HCL 2 MG/2ML IJ SOLN
INTRAMUSCULAR | Status: AC
  Filled 2019-04-03: qty 2

## 2019-04-03 MED ORDER — MIDAZOLAM HCL 5 MG/5ML IJ SOLN
INTRAMUSCULAR | Status: DC | PRN
  Administered 2019-04-03: 2 mg via INTRAVENOUS

## 2019-04-03 MED ORDER — LACTATED RINGERS IV SOLN
INTRAVENOUS | Status: DC
  Administered 2019-04-03: 11:00:00 1000 mL via INTRAVENOUS

## 2019-04-03 MED ORDER — FERROUS SULFATE 325 (65 FE) MG PO TABS: 325.0000 mg | ORAL_TABLET | Freq: Two times a day (BID) | ORAL | 3 refills | Status: DC

## 2019-04-03 MED ORDER — PROPOFOL 500 MG/50ML IV EMUL
INTRAVENOUS | Status: DC | PRN
  Administered 2019-04-03: 120 ug/kg/min via INTRAVENOUS

## 2019-04-03 MED ORDER — PROPOFOL 500 MG/50ML IV EMUL
INTRAVENOUS | Status: DC | PRN
  Administered 2019-04-03: 40 mg via INTRAVENOUS

## 2019-04-03 MED ORDER — LACTATED RINGERS IV SOLN
INTRAVENOUS | Status: DC | PRN
  Administered 2019-04-03: 11:00:00 via INTRAVENOUS

## 2019-04-03 SURGICAL SUPPLY — 22 items

## 2019-04-03 NOTE — Progress Notes (Signed)
Pt off unit for colonoscopy

## 2019-04-03 NOTE — Interval H&P Note (Signed)
History and Physical Interval Note:  04/03/2019 10:53 AM  Gabrielle Roberts  has presented today for surgery, with the diagnosis of GI bleed.  The various methods of treatment have been discussed with the patient and family. After consideration of risks, benefits and other options for treatment, the patient has consented to  Procedure(s): COLONOSCOPY WITH PROPOFOL (N/A) as a surgical intervention.  The patient's history has been reviewed, patient examined, no change in status, stable for surgery.  I have reviewed the patient's chart and labs.  Questions were answered to the patient's satisfaction.     Nancy Fetter

## 2019-04-03 NOTE — Anesthesia Postprocedure Evaluation (Signed)
Anesthesia Post Note  Patient: Gabrielle Roberts  Procedure(s) Performed: COLONOSCOPY WITH PROPOFOL (N/A )     Patient location during evaluation: Endoscopy Anesthesia Type: MAC Level of consciousness: awake and alert Pain management: pain level controlled Vital Signs Assessment: post-procedure vital signs reviewed and stable Respiratory status: spontaneous breathing, nonlabored ventilation, respiratory function stable and patient connected to nasal cannula oxygen Cardiovascular status: blood pressure returned to baseline and stable Postop Assessment: no apparent nausea or vomiting Anesthetic complications: no    Last Vitals:  Vitals:   04/03/19 1209 04/03/19 1300  BP: (!) 166/106 (!) 161/75  Pulse: 91 62  Resp: 18 (!) 28  Temp: 37 C   SpO2: 98% 98%    Last Pain:  Vitals:   04/03/19 1209  TempSrc: Oral  PainSc: 0-No pain                 Barnet Glasgow

## 2019-04-03 NOTE — Progress Notes (Signed)
Reviewed discharge instructions with patient including new medication and follow up appts. Pt called daughter to pick her up, pt will be taken down in wheelchair.

## 2019-04-03 NOTE — Discharge Summary (Signed)
Physician Discharge Summary  WYLENE WEISSMAN IEP:[id number] DOB: 04/21/1971 DOA: 04/01/2019  PCP: Patient, No Pcp Per  Admit date: 04/01/2019 Discharge date: 04/03/2019  Admitted From: Home Disposition: Home  Recommendations for Outpatient Follow-up:  1. Follow up with PCP in 1-2 weeks 2. Check CBC in 1 week  Home Health: Not applicable Equipment/Devices: Not applicable  Discharge Condition: Stable CODE STATUS: Full code Diet recommendation: Regular diet  Brief/Interim Summary: 48 year old female with history of chronic anemia, last known hemoglobin 9.  Ago who presented to the emergency room with nausea, vomiting and an episode of bright red rectal bleeding that started on the morning of admission.  Patient has no history of melanotic stools or hematemesis.  Had left lower quadrant abdominal cramping prior to the episode.  Patient was found with hemoglobin of 3.7 in the emergency room.  COVID-19 was negative.  Discharge Diagnoses:  Principal Problem:   Symptomatic anemia Active Problems:   Lower GI bleed   Elevated blood pressure reading   Thrombocytosis (HCC)  Symptomatic anemia, acute on chronic blood loss anemia with rectal bleeding: She was admitted to the hospital.  Received total 4 units of PRBC with appropriate response and hemoglobin has remained more than 8.  Underwent colonoscopy with preparation and found to have no active bleeding only grade 1 internal hemorrhoids.  No polyps or diverticulosis.  Along with 4 units of PRBC, she also received 1 dose of IV iron.  Ferritin was 2. Patient also has history of menorrhagia in the past, currently with not much symptoms.  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  Patient probably has chronic anemia related to uterine bleeding or abnormalities.  Plan: Clinically stable after colonoscopy.  No GI etiology found.  Patient discharged home with oral iron.  A referral was sent to gynecology for follow-up.  Patient  understands instructions.  Discharge Instructions  Discharge Instructions    Ambulatory referral to Gynecology   Complete by: As directed    Menorrhagia , severe anemia and fibroid uterus   Diet - low sodium heart healthy   Complete by: As directed    Increase activity slowly   Complete by: As directed      Allergies as of 04/03/2019   No Known Allergies     Medication List    STOP taking these medications   cyclobenzaprine 10 MG tablet Commonly known as: FLEXERIL   HYDROcodone-acetaminophen 5-325 MG tablet Commonly known as: NORCO/VICODIN   ibuprofen 600 MG tablet Commonly known as: ADVIL   methocarbamol 750 MG tablet Commonly known as: Robaxin-750     TAKE these medications   ferrous sulfate 325 (65 FE) MG tablet Take 1 tablet (325 mg total) by mouth 2 (two) times daily with a meal.       No Known Allergies  Consultations:  Gastroenterology   Procedures/Studies: Ct Abdomen Pelvis W Contrast  Result Date: 04/01/2019 CLINICAL DATA:  Abdominal pain with nausea and vomiting. Rectal bleeding EXAM: CT ABDOMEN AND PELVIS WITH CONTRAST TECHNIQUE: Multidetector CT imaging of the abdomen and pelvis was performed using the standard protocol following bolus administration of intravenous contrast. CONTRAST:  11mL OMNIPAQUE IOHEXOL 300 MG/ML  SOLN COMPARISON:  July 10, 2014 FINDINGS: Lower chest: There is stable scarring in the lateral left base. There is no lung base edema or consolidation. Hepatobiliary: There are innumerable cysts throughout the liver, also present on previous study. There is a Riedel's lobe on the right, an anatomic variant. Pancreas: There is no evident pancreatic mass or  inflammatory focus. Spleen: There is an apparent cyst near the dome of the spleen measuring 6 mm. No other splenic lesions evident. Adrenals/Urinary Tract: Adrenals bilaterally appear normal. Kidneys bilaterally show no evident mass or hydronephrosis on either side. Suspect prominent  column of Bertin on the right, an anatomic variant. There is a 2 mm calculus in the lower pole of the right kidney. There is no evident ureteral calculus on either side. Urinary bladder is midline with wall thickness within normal limits. Stomach/Bowel: There is no appreciable bowel wall or mesenteric thickening. No evident bowel obstruction. Terminal ileum appears unremarkable. There is no free air or portal venous air. Vascular/Lymphatic: There is no abdominal aortic aneurysm. No vascular lesions are demonstrable on this study. There is no appreciable adenopathy in the abdomen or pelvis. Reproductive: The uterus is anteverted. The uterus is prominent and somewhat lobular in contour, likely indicative of leiomyomatous change. There is a 3.0 x 2.8 cm likely dominant follicle right ovary. Other: Appendix appears normal. No evident abscess or ascites in the abdomen or pelvis. Musculoskeletal: No blastic or lytic bone lesions. No intramuscular or abdominal wall lesions are evident. IMPRESSION: 1. 2 mm nonobstructing calculus lower pole left kidney. No hydronephrosis or ureteral calculus on either side. Urinary bladder wall thickness normal. Note that the areas of inhomogeneous enhancement in the kidneys noted on previous study are no longer evident. Enhancement is symmetric bilaterally currently. 2. No bowel obstruction. No abscess in the abdomen or pelvis. Appendix appears normal. 3. Apparent leiomyomatous uterus. Probable dominant follicle right ovary. 4.  Innumerable apparent liver cysts, a stable finding. Electronically Signed   By: Lowella Grip III M.D.   On: 04/01/2019 17:52   US Pelvic Complete With Transvaginal  Result Date: 04/02/2019 CLINICAL DATA:  Menorrhagia. EXAM: TRANSABDOMINAL AND TRANSVAGINAL ULTRASOUND OF PELVIS TECHNIQUE: Both transabdominal and transvaginal ultrasound examinations of the pelvis were performed. Transabdominal technique was performed for global imaging of the pelvis including  uterus, ovaries, adnexal regions, and pelvic cul-de-sac. It was necessary to proceed with endovaginal exam following the transabdominal exam to visualize the uterus and ovaries. COMPARISON:  None FINDINGS: Uterus Measurements: 13.3 x 7.6 x 7.9 cm = volume: 417.8 mL. Multiple fibroids exist throughout the uterus. The largest measures 4.1 cm in maximum diameter. Endometrium Thickness: 17.3 mm.  No focal abnormality visualized. Right ovary Measurements: 4.8 x 3.3 x 3.4 cm = volume: 25.8 mL. 3.6 x 2.5 x 2.7 cm simple cyst. Left ovary Measurements: 2.8 x 1.8 x 2.8 cm = volume: 7.3 mL. Normal appearance/no adnexal mass. Other findings No abnormal free fluid. IMPRESSION: 1. Fibroid uterus. Multiple fibroids are present with the largest measuring 4.1 cm in diameter. 2. Endometrial thickening at 17.3 mm. If bleeding remains unresponsive to hormonal or medical therapy, focal lesion work-up with sonohysterogram should be considered. Endometrial biopsy should also be considered in pre-menopausal patients at high risk for endometrial carcinoma. (Ref: Radiological Reasoning: Algorithmic Workup of Abnormal Vaginal Bleeding with Endovaginal Sonography and Sonohysterography. AJR 2008; 637:C58-85) Electronically Signed   By: Marcello Moores  Register   On: 04/02/2019 16:06    Colonoscopy: Finding as above.   Subjective: Patient was seen and examined.  Normal symptoms.  She was able to eat well.  Hemoglobin remained stable.   Discharge Exam: Vitals:   04/03/19 1150 04/03/19 1209  BP: 132/90 (!) 166/106  Pulse: 75 91  Resp: 17 18  Temp:  98.6 F (37 C)  SpO2: 100% 98%   Vitals:   04/03/19 1140 04/03/19 1145  04/03/19 1150 04/03/19 1209  BP: (!) 159/69  132/90 (!) 166/106  Pulse: 67 75 75 91  Resp: 19 (!) 25 17 18   Temp:    98.6 F (37 C)  TempSrc:    Oral  SpO2: 100% 100% 100% 98%  Weight:      Height:        General: Pt is alert, awake, not in acute distress Cardiovascular: RRR, S1/S2 +, no rubs, no  gallops Respiratory: CTA bilaterally, no wheezing, no rhonchi Abdominal: Soft, NT, ND, bowel sounds + Extremities: no edema, no cyanosis    The results of significant diagnostics from this hospitalization (including imaging, microbiology, ancillary and laboratory) are listed below for reference.     Microbiology: Recent Results (from the past 240 hour(s))  SARS Coronavirus 2 (CEPHEID - Performed in [place] hospital lab), Hosp Order     Status: None   Collection Time: 04/01/19  5:12 PM   Specimen: Nasopharyngeal Swab  Result Value Ref Range Status   SARS Coronavirus 2 NEGATIVE NEGATIVE Final    Comment: (NOTE) If result is NEGATIVE SARS-CoV-2 target nucleic acids are NOT DETECTED. The SARS-CoV-2 RNA is generally detectable in upper and lower  respiratory specimens during the acute phase of infection. The lowest  concentration of SARS-CoV-2 viral copies this assay can detect is 250  copies / mL. A negative result does not preclude SARS-CoV-2 infection  and should not be used as the sole basis for treatment or other  patient management decisions.  A negative result may occur with  improper specimen collection / handling, submission of specimen other  than nasopharyngeal swab, presence of viral mutation(s) within the  areas targeted by this assay, and inadequate number of viral copies  (<250 copies / mL). A negative result must be combined with clinical  observations, patient history, and epidemiological information. If result is POSITIVE SARS-CoV-2 target nucleic acids are DETECTED. The SARS-CoV-2 RNA is generally detectable in upper and lower  respiratory specimens dur ing the acute phase of infection.  Positive  results are indicative of active infection with SARS-CoV-2.  Clinical  correlation with patient history and other diagnostic information is  necessary to determine patient infection status.  Positive results do  not rule out bacterial infection or co-infection with  other viruses. If result is PRESUMPTIVE POSTIVE SARS-CoV-2 nucleic acids MAY BE PRESENT.   A presumptive positive result was obtained on the submitted specimen  and confirmed on repeat testing.  While 2019 novel coronavirus  (SARS-CoV-2) nucleic acids may be present in the submitted sample  additional confirmatory testing may be necessary for epidemiological  and / or clinical management purposes  to differentiate between  SARS-CoV-2 and other Sarbecovirus currently known to infect humans.  If clinically indicated additional testing with an alternate test  methodology [phone]) is advised. The SARS-CoV-2 RNA is generally  detectable in upper and lower respiratory sp ecimens during the acute  phase of infection. The expected result is Negative. Fact Sheet for Patients:  StrictlyIdeas.no Fact Sheet for Healthcare Providers: BankingDealers.co.za This test is not yet approved or cleared by the [place] FDA and has been authorized for detection and/or diagnosis of SARS-CoV-2 by FDA under an Emergency Use Authorization (EUA).  This EUA will remain in effect (meaning this test can be used) for the duration of the COVID-19 declaration under Section 564(b)(1) of the Act, 21 U.S.C. section 360bbb-3(b)(1), unless the authorization is terminated or revoked sooner. Performed at [facility], [address]  [place], [place] [address]   MRSA PCR Screening     Status: None   Collection Time: 04/01/19 10:02 PM   Specimen: Nasal Mucosa; Nasopharyngeal  Result Value Ref Range Status   MRSA by PCR NEGATIVE NEGATIVE Final    Comment:        The GeneXpert MRSA Assay (FDA approved for NASAL specimens only), is one component of a comprehensive MRSA colonization surveillance program. It is not intended to diagnose MRSA infection nor to guide or monitor treatment for MRSA infections. Performed at [facility],  [place] [address], [place], [place] [address]      Labs: BNP (last 3 results) No results for input(s): BNP in the last 8760 hours. Basic Metabolic Panel: Recent Labs  Lab 04/01/19 1356 04/02/19 0225 04/03/19 0537  NA 140 136 138  K 3.6 3.6 3.6  CL 107 107 107  CO2 25 23 23   GLUCOSE 93 90 83  BUN 9 8 6   CREATININE 0.76 0.58 0.65  CALCIUM 9.3 8.7* 9.3  MG  --  2.0  --   PHOS  --  3.6  --    Liver Function Tests: Recent Labs  Lab 04/01/19 1356 04/02/19 0225  AST 17 14*  ALT 8 7  ALKPHOS 33* 31*  BILITOT 0.2* 0.9  PROT 8.0 6.9  ALBUMIN 4.4 3.8   No results for input(s): LIPASE, AMYLASE in the last 168 hours. No results for input(s): AMMONIA in the last 168 hours. CBC: Recent Labs  Lab 04/01/19 1356 04/02/19 0225 04/02/19 1030 04/02/19 1847 04/03/19 0537  WBC 7.4 8.6  --  9.0 8.8  HGB 3.7* 5.8* 8.7* 8.6* 8.5*  HCT 15.6* 21.2* 28.6* 29.1* 28.3*  MCV 64.5* 74.4*  --  78.4* 78.0*  PLT 1,063* 868*  --  687* 589*   Cardiac Enzymes: No results for input(s): CKTOTAL, CKMB, CKMBINDEX, TROPONINI in the last 168 hours. BNP: Invalid input(s): POCBNP CBG: No results for input(s): GLUCAP in the last 168 hours. D-Dimer No results for input(s): DDIMER in the last 72 hours. Hgb A1c No results for input(s): HGBA1C in the last 72 hours. Lipid Profile No results for input(s): CHOL, HDL, LDLCALC, TRIG, CHOLHDL, LDLDIRECT in the last 72 hours. Thyroid function studies Recent Labs    04/02/19 0225  TSH 2.152   Anemia work up Recent Labs    04/01/19 1356 04/01/19 1712  VITAMINB12  --  280  FOLATE  --  11.1  FERRITIN  --  2*  TIBC  --  522*  IRON  --  10*  RETICCTPCT 0.6  --    Urinalysis    Component Value Date/Time   COLORURINE YELLOW 07/10/2014 1152   APPEARANCEUR CLOUDY (A) 07/10/2014 1152   LABSPEC 1.023 07/10/2014 1152   PHURINE 5.5 07/10/2014 1152   GLUCOSEU NEGATIVE 07/10/2014 1152   HGBUR LARGE (A) 07/10/2014 1152   BILIRUBINUR NEGATIVE 07/10/2014  1152   KETONESUR 15 (A) 07/10/2014 1152   PROTEINUR 100 (A) 07/10/2014 1152   UROBILINOGEN 0.2 07/10/2014 1152   NITRITE NEGATIVE 07/10/2014 1152   LEUKOCYTESUR MODERATE (A) 07/10/2014 1152   Sepsis Labs Invalid input(s): PROCALCITONIN,  WBC,  LACTICIDVEN Microbiology Recent Results (from the past 240 hour(s))  SARS Coronavirus 2 (CEPHEID - Performed in [place] hospital lab), Hosp Order     Status: None   Collection Time: 04/01/19  5:12 PM   Specimen: Nasopharyngeal Swab  Result Value Ref Range Status   SARS Coronavirus 2 NEGATIVE NEGATIVE Final  Comment: (NOTE) If result is NEGATIVE SARS-CoV-2 target nucleic acids are NOT DETECTED. The SARS-CoV-2 RNA is generally detectable in upper and lower  respiratory specimens during the acute phase of infection. The lowest  concentration of SARS-CoV-2 viral copies this assay can detect is 250  copies / mL. A negative result does not preclude SARS-CoV-2 infection  and should not be used as the sole basis for treatment or other  patient management decisions.  A negative result may occur with  improper specimen collection / handling, submission of specimen other  than nasopharyngeal swab, presence of viral mutation(s) within the  areas targeted by this assay, and inadequate number of viral copies  (<250 copies / mL). A negative result must be combined with clinical  observations, patient history, and epidemiological information. If result is POSITIVE SARS-CoV-2 target nucleic acids are DETECTED. The SARS-CoV-2 RNA is generally detectable in upper and lower  respiratory specimens dur ing the acute phase of infection.  Positive  results are indicative of active infection with SARS-CoV-2.  Clinical  correlation with patient history and other diagnostic information is  necessary to determine patient infection status.  Positive results do  not rule out bacterial infection or co-infection with other viruses. If result is PRESUMPTIVE  POSTIVE SARS-CoV-2 nucleic acids MAY BE PRESENT.   A presumptive positive result was obtained on the submitted specimen  and confirmed on repeat testing.  While 2019 novel coronavirus  (SARS-CoV-2) nucleic acids may be present in the submitted sample  additional confirmatory testing may be necessary for epidemiological  and / or clinical management purposes  to differentiate between  SARS-CoV-2 and other Sarbecovirus currently known to infect humans.  If clinically indicated additional testing with an alternate test  methodology [phone]) is advised. The SARS-CoV-2 RNA is generally  detectable in upper and lower respiratory sp ecimens during the acute  phase of infection. The expected result is Negative. Fact Sheet for Patients:  StrictlyIdeas.no Fact Sheet for Healthcare Providers: BankingDealers.co.za This test is not yet approved or cleared by the [place] FDA and has been authorized for detection and/or diagnosis of SARS-CoV-2 by FDA under an Emergency Use Authorization (EUA).  This EUA will remain in effect (meaning this test can be used) for the duration of the COVID-19 declaration under Section 564(b)(1) of the Act, 21 U.S.C. section 360bbb-3(b)(1), unless the authorization is terminated or revoked sooner. Performed at [facility], [place] [address], [place], [place] [address]   MRSA PCR Screening     Status: None   Collection Time: 04/01/19 10:02 PM   Specimen: Nasal Mucosa; Nasopharyngeal  Result Value Ref Range Status   MRSA by PCR NEGATIVE NEGATIVE Final    Comment:        The GeneXpert MRSA Assay (FDA approved for NASAL specimens only), is one component of a comprehensive MRSA colonization surveillance program. It is not intended to diagnose MRSA infection nor to guide or monitor treatment for MRSA infections. Performed at [facility], [place] [address], [place], [place]  [address]      Time coordinating discharge: 32 minutes  SIGNED:   Barb Merino, MD  [facility] 04/03/2019, 12:40 PM Pager [phone]  If 7PM-7AM, please contact night-coverage [email or web address] Password [email or web address]

## 2019-04-03 NOTE — Anesthesia Preprocedure Evaluation (Addendum)
Anesthesia Evaluation  Patient identified by MRN, date of birth, ID band Patient awake    Reviewed: Allergy & Precautions, NPO status , Patient's Chart, lab work & pertinent test results  Airway Mallampati: II  TM Distance: >3 FB Neck ROM: Full    Dental no notable dental hx. (+) Teeth Intact   Pulmonary neg pulmonary ROS,    Pulmonary exam normal breath sounds clear to auscultation       Cardiovascular negative cardio ROS Normal cardiovascular exam Rhythm:Regular Rate:Normal     Neuro/Psych negative neurological ROS  negative psych ROS   GI/Hepatic negative GI ROS, Neg liver ROS,   Endo/Other  negative endocrine ROS  Renal/GU negative Renal ROS     Musculoskeletal   Abdominal   Peds  Hematology  (+) anemia ,   Anesthesia Other Findings   Reproductive/Obstetrics                            Anesthesia Physical Anesthesia Plan  ASA: II  Anesthesia Plan: MAC   Post-op Pain Management:    Induction: Intravenous  PONV Risk Score and Plan: 3 and Treatment may vary due to age or medical condition, Dexamethasone and Ondansetron  Airway Management Planned: Simple Face Mask and Natural Airway  Additional Equipment:   Intra-op Plan:   Post-operative Plan:   Informed Consent: I have reviewed the patients History and Physical, chart, labs and discussed the procedure including the risks, benefits and alternatives for the proposed anesthesia with the patient or authorized representative who has indicated his/her understanding and acceptance.       Plan Discussed with:   Anesthesia Plan Comments:       Anesthesia Quick Evaluation

## 2019-04-03 NOTE — Op Note (Signed)
[facility] Patient Name: Gabrielle Roberts Procedure Date: 04/03/2019 MRN: [id number] Attending MD: Nancy Fetter Dr., MD Date of Birth: 10/28/1970 CSN: [id number] Age: 48 Admit Type: Inpatient Procedure:                Colonoscopy Indications:              Rectal bleeding, Iron deficiency anemia secondary                            to chronic blood loss Providers:                Jeneen Rinks L. Deleon Passe Dr., MD, Burtis Junes, RN, Elspeth Cho Tech., Technician, Arnoldo Hooker, CRNA Referring MD:              Medicines:                Monitored Anesthesia Care Complications:            No immediate complications. Estimated Blood Loss:     Estimated blood loss: none. Procedure:                Pre-Anesthesia Assessment:                           - Prior to the procedure, a History and Physical                            was performed, and patient medications and                            allergies were reviewed. The patient's tolerance of                            previous anesthesia was also reviewed. The risks                            and benefits of the procedure and the sedation                            options and risks were discussed with the patient.                            All questions were answered, and informed consent                            was obtained. Prior Anticoagulants: The patient has                            taken no previous anticoagulant or antiplatelet                            agents. ASA Grade Assessment: II - A patient with  mild systemic disease. After reviewing the risks                            and benefits, the patient was deemed in                            satisfactory condition to undergo the procedure.                           After obtaining informed consent, the colonoscope                            was passed under direct vision. Throughout the   procedure, the patient's blood pressure, pulse, and                            oxygen saturations were monitored continuously. The                            [id number]-[id number] ([id number] Olympus pediatric colonscope                            was introduced through the anus and advanced to the                            the cecum, identified by appendiceal orifice and                            ileocecal valve. The colonoscopy was performed                            without difficulty. The patient tolerated the                            procedure well. The quality of the bowel                            preparation was good. The ileocecal valve,                            appendiceal orifice, and rectum were photographed. Scope In: 11:09:00 AM Scope Out: 11:32:40 AM Scope Withdrawal Time: 0 hours 15 minutes 46 seconds  Total Procedure Duration: 0 hours 23 minutes 40 seconds  Findings:      The perianal and digital rectal examinations were normal.      There is no endoscopic evidence of bleeding, diverticula, inflammation,       mass, polyps, ulcerations or tumor in the entire colon.      Non-bleeding internal hemorrhoids were found during retroflexion. The       hemorrhoids were small and Grade I (internal hemorrhoids that do not       prolapse). Impression:               - Non-bleeding internal hemorrhoids.                           -  No specimens collected.                           - Blood found in stool Moderate Sedation:      See anesthesia note, no moderate sedation. Recommendation:           - Return patient to hospital ward for ongoing care.                           - Continue present medications.                           - Repeat colonoscopy in 10 years for screening                            purposes. Procedure Code(s):        --- Professional ---                           [id number], Colonoscopy, flexible; diagnostic, including                            collection of specimen(s)  by brushing or washing,                            when performed (separate procedure) Diagnosis Code(s):        --- Professional ---                           D50.0, Iron deficiency anemia secondary to blood                            loss (chronic)                           K62.5, Hemorrhage of anus and rectum                           K64.0, First degree hemorrhoids CPT copyright 2019 [employer]. All rights reserved. The codes documented in this report are preliminary and upon coder review may  be revised to meet current compliance requirements. Nancy Fetter Dr., MD 04/03/2019 11:48:35 AM This report has been signed electronically. Number of Addenda: 0

## 2019-04-03 NOTE — Transfer of Care (Signed)
Immediate Anesthesia Transfer of Care Note  Patient: Gabrielle Roberts  Procedure(s) Performed: Procedure(s): COLONOSCOPY WITH PROPOFOL (N/A)  Patient Location: PACU  Anesthesia Type:MAC  Level of Consciousness:  sedated, patient cooperative and responds to stimulation  Airway & Oxygen Therapy:Patient Spontanous Breathing and Patient connected to face mask oxgen  Post-op Assessment:  Report given to PACU RN and Post -op Vital signs reviewed and stable  Post vital signs:  Reviewed and stable  Last Vitals:  Vitals:   04/03/19 1011 04/03/19 1044  BP: (!) 176/81 (!) 168/97  Pulse: (!) 52 65  Resp: (!) 21 (!) 22  Temp:  36.9 C  SpO2: 42% 39%    Complications: No apparent anesthesia complications

## 2019-04-04 ENCOUNTER — Encounter (HOSPITAL_COMMUNITY): Payer: Self-pay | Admitting: Gastroenterology

## 2019-04-05 ENCOUNTER — Other Ambulatory Visit: Payer: Self-pay

## 2019-04-08 ENCOUNTER — Ambulatory Visit (INDEPENDENT_AMBULATORY_CARE_PROVIDER_SITE_OTHER): Payer: Commercial Managed Care - PPO | Admitting: Obstetrics & Gynecology

## 2019-04-08 ENCOUNTER — Other Ambulatory Visit: Payer: Self-pay

## 2019-04-08 ENCOUNTER — Encounter: Payer: Self-pay | Admitting: Obstetrics & Gynecology

## 2019-04-08 ENCOUNTER — Other Ambulatory Visit (HOSPITAL_COMMUNITY)
Admission: RE | Admit: 2019-04-08 | Discharge: 2019-04-08 | Disposition: A | Discharge status: Home / Self Care | Payer: Commercial Managed Care - PPO | Source: Ambulatory Visit | Attending: Obstetrics & Gynecology | Admitting: Obstetrics & Gynecology

## 2019-04-08 VITALS — BP 142/84 | HR 60 | Temp 97.3°F | Ht 62.75 in | Wt 188.0 lb

## 2019-04-08 DIAGNOSIS — Z113 Encounter for screening for infections with a predominantly sexual mode of transmission: Secondary | ICD-10-CM | POA: Diagnosis present

## 2019-04-08 DIAGNOSIS — N920 Excessive and frequent menstruation with regular cycle: Secondary | ICD-10-CM | POA: Diagnosis not present

## 2019-04-08 DIAGNOSIS — D251 Intramural leiomyoma of uterus: Secondary | ICD-10-CM

## 2019-04-08 DIAGNOSIS — F5089 Other specified eating disorder: Secondary | ICD-10-CM | POA: Diagnosis not present

## 2019-04-08 DIAGNOSIS — Z124 Encounter for screening for malignant neoplasm of cervix: Secondary | ICD-10-CM | POA: Insufficient documentation

## 2019-04-08 DIAGNOSIS — N852 Hypertrophy of uterus: Secondary | ICD-10-CM

## 2019-04-08 NOTE — Progress Notes (Signed)
48 y.o. [id number] Single Black or African American female here for new patient exam.  She was referred from Dr. Sloan Leiter   For the last two months, her cycles have been very heavy.  Cycles lasted 6-7 days.  Flow was very heavy with clots.  Reports she was really craving ice and starch over the past few months.    She went to ER on 7/6.  Hb was 3.7 initially.  She was transfused 4 units and hb is now 8.5.  She was given one dose of IV iron.  CT was done showing enlarged uterus and small renal stone.  Ultrasound was 13.3 x 7.6 x 7.9cm with multiple fibroids.  Volume was 417.46ml.    Patient's last menstrual period was 03/25/2019.          Sexually active: No.  The current method of family planning is condoms every time and abstinence.  Exercising: No.   Smoker:  no  Health Maintenance: Pap:  unsure History of abnormal Pap:  no MMG:  never Colonoscopy:  04/03/19 BMD:   Never TDaP:  2013 Screening Labs: PCP   reports that she has never smoked. She has never used smokeless tobacco. She reports current alcohol use. She reports that she does not use drugs.  Past Medical History:  Diagnosis Date  . Anemia     Past Surgical History:  Procedure Laterality Date  . BILATERAL SALPINGECTOMY    . COLONOSCOPY WITH PROPOFOL N/A 04/03/2019   Procedure: COLONOSCOPY WITH PROPOFOL;  Surgeon: Laurence Spates, MD;  Location: [facility];  Service: Gastroenterology;  Laterality: N/A;  . ORIF ZYGOMATIC FRACTURE  01/27/2012   Procedure: OPEN REDUCTION INTERNAL FIXATION (ORIF) ZYGOMATIC FRACTURE;  Surgeon: Ascencion Dike, MD;  Location: [facility];  Service: ENT;  Laterality: Right;  repair left lip laceration    Current Outpatient Medications  Medication Sig Dispense Refill  . ferrous sulfate 325 (65 FE) MG tablet Take 1 tablet (325 mg total) by mouth 2 (two) times daily with a meal. 30 tablet 3   No current facility-administered medications for this visit.     Family History  Problem Relation Age of Onset  .  Diabetes Mother   . Hypertension Mother     Review of Systems  All other systems reviewed and are negative.   Exam:   BP (!) 142/84   Pulse 60   Temp (!) 97.3 F (36.3 C) (Temporal)   Ht 5' 2.75" (1.594 m)   Wt 188 lb (85.3 kg)   LMP 03/25/2019   BMI 33.57 kg/m   Height: 5' 2.75" (159.4 cm)  Ht Readings from Last 3 Encounters:  04/08/19 5' 2.75" (1.594 m)  04/03/19 5\' 4"  (1.626 m)  10/17/17 5\' 4"  (1.626 m)    General appearance: alert, cooperative and appears stated age Head: Normocephalic, without obvious abnormality, atraumatic Neck: no adenopathy, supple, symmetrical, trachea midline and thyroid normal to inspection and palpation Lungs: clear to auscultation bilaterally Heart: regular rate and rhythm Abdomen: soft, non-tender; bowel sounds normal; no masses,  no organomegaly Extremities: extremities normal, atraumatic, no cyanosis or edema Skin: Skin color, texture, turgor normal. No rashes or lesions Lymph nodes: Cervical, supraclavicular, and axillary nodes normal. No abnormal inguinal nodes palpated Neurologic: Grossly normal  Pelvic: External genitalia:  no lesions              Urethra:  normal appearing urethra with no masses, tenderness or lesions  Bartholins and Skenes: normal                 Vagina: normal appearing vagina with normal color and discharge, no lesions              Cervix: no lesions              Pap taken: Yes.   Bimanual Exam:  Uterus:  enlarged, 14 weeks size, globular, mobile              Adnexa: normal adnexa               Rectovaginal: Confirms               Anus:  normal sphincter tone, no lesions  Endometrial biopsy recommended.  Discussed with patient.  Verbal and written consent obtained.   Procedure:  Speculum placed.  Cervix visualized and cleansed with betadine prep.  A single toothed tenaculum was applied to the anterior lip of the cervix.  Endometrial pipelle was advanced through the cervix into the endometrial cavity  without difficulty.  Pipelle passed to 8cm.  Suction applied and pipelle removed with good tissue sample obtained.  Tenculum removed.  No bleeding noted.  Patient tolerated procedure well.  Chaperone was present for exam.  A:  H/O menorrhagia with regular cycles Enlarged uterus with multiple fibroids H/o iron deficiency anemia s/p 4 transfusions of PRBC and one iron infusion PICA, improved  P:   Mammogram guidelines reviewed.  She is going to schedule this Return to work note given pap smear obtained today with HR HPV testing GC/CHl/trich testing obtained today as well Endometrial biopsy pending Options for treatment including OCPs, [place], IUD, myomectomy, hysterectomy reviewed.  Pt is aware I do not feel endometrial ablation is a good option for her.  We will discuss once test results are finalized.  ~45 minutes spent with patient >50% of time was in face to face discussion of above.

## 2019-04-10 LAB — CYTOLOGY - PAP
Adequacy: ABSENT — AB
Chlamydia: NEGATIVE
Diagnosis: UNDETERMINED — AB
HPV: NOT DETECTED
Neisseria Gonorrhea: NEGATIVE
Trichomonas: NEGATIVE

## 2019-04-17 ENCOUNTER — Telehealth: Payer: Self-pay | Admitting: *Deleted

## 2019-04-17 NOTE — Telephone Encounter (Signed)
-----   Message from Megan Salon, MD sent at 04/15/2019  5:47 PM EDT ----- Gay Filler, Can you see about her HR HPV I ordered with this pap?  I need this for directing the rest of her care.  Can you let her her know her biopsy is negative except for showing an endometrial polyp that is benign, her GC/CHl/trich is negative and pap is pending.

## 2019-04-17 NOTE — Telephone Encounter (Signed)
Call to patient. Left message to call back. Per DPR, can leave message on voice mail.

## 2019-04-18 NOTE — Telephone Encounter (Signed)
Return call to [place].

## 2019-04-18 NOTE — Telephone Encounter (Signed)
Call to patient. Left message to call back. Can speak to any triage nurse.

## 2019-04-18 NOTE — Telephone Encounter (Signed)
Return call to patient. She is currently at work and requests call back after 4 pm.

## 2019-04-18 NOTE — Telephone Encounter (Signed)
-----   Message from Megan Salon, MD sent at 04/18/2019 11:40 AM EDT ----- Gay Filler, Can you please call this pt.  She was hospitalized for the very low hemoglobin and received 4 units PRBC.    Pap showed ASCUS and negative HR HPV.  Needs repeat pap and HR HPV 3 years.  08 recall needed for this.  Her GC/Chl/trich was negative.  Endometrial biopsy showed benign endometrial polyp.  Has she made any decisions about treatment options for her bleeding?    CC:  Lowell Bouton, CMA

## 2019-04-22 NOTE — Telephone Encounter (Signed)
Call to patient. Advised of all test results as instructed by Dr Sabra Heck.  Stressed importance of follow-up pap in three years.  Discussed treatment options for heavy cycles.  Patient states she is still undecided. She asked about [place].  States she desires to be done with bleeding. Offered Webex or consult if feels she needs additional information. Discussed OCP or IUD could be done quickly and less permanent options to allow her more time to decide.  Discussed importance of preventing significant anemia from recurring.  Patient states she will consider and call back.    Routing to Dr Sabra Heck- Juluis Rainier.  Encounter closed.

## 2020-04-13 ENCOUNTER — Encounter (HOSPITAL_COMMUNITY): Payer: Self-pay

## 2020-04-13 ENCOUNTER — Other Ambulatory Visit: Payer: Self-pay

## 2020-04-13 DIAGNOSIS — M549 Dorsalgia, unspecified: Secondary | ICD-10-CM | POA: Insufficient documentation

## 2020-04-13 DIAGNOSIS — R1011 Right upper quadrant pain: Secondary | ICD-10-CM | POA: Insufficient documentation

## 2020-04-13 DIAGNOSIS — R509 Fever, unspecified: Secondary | ICD-10-CM | POA: Insufficient documentation

## 2020-04-13 DIAGNOSIS — N12 Tubulo-interstitial nephritis, not specified as acute or chronic: Secondary | ICD-10-CM | POA: Insufficient documentation

## 2020-04-13 DIAGNOSIS — R5383 Other fatigue: Secondary | ICD-10-CM | POA: Insufficient documentation

## 2020-04-13 MED ORDER — ACETAMINOPHEN 325 MG PO TABS
650.0000 mg | ORAL_TABLET | Freq: Once | ORAL | Status: AC | PRN
  Administered 2020-04-13: 650 mg via ORAL
  Filled 2020-04-13: qty 2

## 2020-04-13 NOTE — ED Triage Notes (Signed)
RUQ pain radiating to back.

## 2020-04-14 ENCOUNTER — Emergency Department (HOSPITAL_COMMUNITY)
Admission: EM | Admit: 2020-04-14 | Discharge: 2020-04-14 | Disposition: A | Discharge status: Home / Self Care | Payer: Self-pay | Attending: Emergency Medicine | Admitting: Emergency Medicine

## 2020-04-14 ENCOUNTER — Emergency Department (HOSPITAL_COMMUNITY): Payer: Self-pay

## 2020-04-14 DIAGNOSIS — N12 Tubulo-interstitial nephritis, not specified as acute or chronic: Secondary | ICD-10-CM

## 2020-04-14 DIAGNOSIS — R1011 Right upper quadrant pain: Secondary | ICD-10-CM

## 2020-04-14 LAB — URINALYSIS, ROUTINE W REFLEX MICROSCOPIC
Bilirubin Urine: NEGATIVE
Glucose, UA: NEGATIVE mg/dL
Ketones, ur: NEGATIVE mg/dL
Nitrite: POSITIVE — AB
Protein, ur: 30 mg/dL — AB
Specific Gravity, Urine: 1.016 (ref 1.005–1.030)
WBC, UA: >50 WBC/hpf — ABNORMAL HIGH (ref 0–5)
pH: 6 (ref 5.0–8.0)

## 2020-04-14 LAB — COMPREHENSIVE METABOLIC PANEL
ALT: 10 U/L (ref 0–44)
AST: 16 U/L (ref 15–41)
Albumin: 4 g/dL (ref 3.5–5.0)
Alkaline Phosphatase: 44 U/L (ref 38–126)
Anion gap: 8 (ref 5–15)
BUN: 10 mg/dL (ref 6–20)
CO2: 26 mmol/L (ref 22–32)
Calcium: 8.8 mg/dL — ABNORMAL LOW (ref 8.9–10.3)
Chloride: 102 mmol/L (ref 98–111)
Creatinine, Ser: 0.66 mg/dL (ref 0.44–1.00)
GFR calc Af Amer: >60 mL/min (ref >60)
GFR calc non Af Amer: >60 mL/min (ref >60)
Glucose, Bld: 94 mg/dL (ref 70–99)
Potassium: 3.1 mmol/L — ABNORMAL LOW (ref 3.5–5.1)
Sodium: 136 mmol/L (ref 135–145)
Total Bilirubin: 0.6 mg/dL (ref 0.3–1.2)
Total Protein: 8.1 g/dL (ref 6.5–8.1)

## 2020-04-14 LAB — CBC
HCT: 29.5 % — ABNORMAL LOW (ref 36.0–46.0)
Hemoglobin: 7.9 g/dL — ABNORMAL LOW (ref 12.0–15.0)
MCH: 19.6 pg — ABNORMAL LOW (ref 26.0–34.0)
MCHC: 26.8 g/dL — ABNORMAL LOW (ref 30.0–36.0)
MCV: 73.2 fL — ABNORMAL LOW (ref 80.0–100.0)
Platelets: 304 10*3/uL (ref 150–400)
RBC: 4.03 MIL/uL (ref 3.87–5.11)
RDW: 21.6 % — ABNORMAL HIGH (ref 11.5–15.5)
WBC: 10.4 10*3/uL (ref 4.0–10.5)
nRBC: 0 % (ref 0.0–0.2)

## 2020-04-14 LAB — LIPASE, BLOOD: Lipase: 24 U/L (ref 11–51)

## 2020-04-14 LAB — I-STAT BETA HCG BLOOD, ED (MC, WL, AP ONLY): I-stat hCG, quantitative: <5 m[IU]/mL (ref <5)

## 2020-04-14 MED ORDER — FENTANYL CITRATE (PF) 100 MCG/2ML IJ SOLN
50.0000 ug | Freq: Once | INTRAMUSCULAR | Status: AC
  Administered 2020-04-14: 50 ug via INTRAVENOUS
  Filled 2020-04-14: qty 2

## 2020-04-14 MED ORDER — IBUPROFEN 800 MG PO TABS
800.0000 mg | ORAL_TABLET | Freq: Once | ORAL | Status: AC
  Administered 2020-04-14: 800 mg via ORAL
  Filled 2020-04-14: qty 1

## 2020-04-14 MED ORDER — IBUPROFEN 600 MG PO TABS
600.0000 mg | ORAL_TABLET | Freq: Four times a day (QID) | ORAL | 0 refills | Status: DC | PRN
Start: 2020-04-14 — End: 2021-04-03

## 2020-04-14 MED ORDER — SODIUM CHLORIDE 0.9 % IV BOLUS
1000.0000 mL | Freq: Once | INTRAVENOUS | Status: AC
  Administered 2020-04-14: 1000 mL via INTRAVENOUS

## 2020-04-14 MED ORDER — CEPHALEXIN 500 MG PO CAPS
500.0000 mg | ORAL_CAPSULE | Freq: Three times a day (TID) | ORAL | 0 refills | Status: DC
Start: 2020-04-14 — End: 2021-04-01

## 2020-04-14 MED ORDER — SODIUM CHLORIDE 0.9 % IV SOLN
1.0000 g | Freq: Once | INTRAVENOUS | Status: AC
  Administered 2020-04-14: 1 g via INTRAVENOUS
  Filled 2020-04-14: qty 10

## 2020-04-14 MED ORDER — ONDANSETRON 4 MG PO TBDP
4.0000 mg | ORAL_TABLET | Freq: Three times a day (TID) | ORAL | 0 refills | Status: DC | PRN
Start: 2020-04-14 — End: 2021-06-14

## 2020-04-14 MED ORDER — ONDANSETRON HCL 4 MG/2ML IJ SOLN
4.0000 mg | Freq: Once | INTRAMUSCULAR | Status: AC
  Administered 2020-04-14: 4 mg via INTRAVENOUS
  Filled 2020-04-14: qty 2

## 2020-04-14 MED ORDER — POTASSIUM CHLORIDE CRYS ER 20 MEQ PO TBCR
40.0000 meq | EXTENDED_RELEASE_TABLET | Freq: Once | ORAL | Status: AC
  Administered 2020-04-14: 40 meq via ORAL
  Filled 2020-04-14: qty 2

## 2020-04-14 NOTE — Discharge Instructions (Addendum)
Take the antibiotics for a kidney infection as prescribed.  Use Tylenol or Motrin as needed for aches and fevers.  Keep yourself hydrated. You will be called if your urine culture requires an antibiotic change. Establish care with a primary doctor. Return to the ED with worsening pain, persistent vomiting, persistent fever, or any other concerns.

## 2020-04-14 NOTE — ED Provider Notes (Signed)
[facility] DEPT Provider Note   CSN: [id number] Arrival date & time: 04/13/20  2338     History Chief Complaint  Patient presents with  . Abdominal Pain    Gabrielle Roberts is a 49 y.o. female.  Patient with history of anemia presenting with 5-day history of right-sided upper abdominal pain that radiates to the mid back.  She reports the pain is constant and never goes away and never improves.  Nothing makes it better or worse.  She has been taking Tylenol without relief.  Febrile on arrival to the ED but did not know she had a fever at home.  She denies any nausea, vomiting, chest pain, shortness of breath.  No pain with urination or blood in the urine.  No vaginal bleeding or vaginal discharge.  Denies any possibility of pregnancy.  No previous abdominal surgeries.  Has had kidney stones in the past. States the pain has been constant and never goes away.  She was eating up until yesterday and now she has no appetite.  Denies any change in bowel movements.  The history is provided by the patient.  Abdominal Pain Associated symptoms: fatigue and fever   Associated symptoms: no chest pain, no cough, no dysuria, no hematuria, no nausea, no shortness of breath, no vaginal bleeding, no vaginal discharge and no vomiting        Past Medical History:  Diagnosis Date  . Anemia     Patient Active Problem List   Diagnosis Date Noted  . Lower GI bleed 04/01/2019  . Symptomatic anemia 04/01/2019  . Elevated blood pressure reading 04/01/2019  . Thrombocytosis ([place]) 04/01/2019    Past Surgical History:  Procedure Laterality Date  . COLONOSCOPY WITH PROPOFOL N/A 04/03/2019   Procedure: COLONOSCOPY WITH PROPOFOL;  Surgeon: Laurence Spates, MD;  Location: [facility];  Service: Gastroenterology;  Laterality: N/A;  . ORIF ZYGOMATIC FRACTURE  01/27/2012   Procedure: OPEN REDUCTION INTERNAL FIXATION (ORIF) ZYGOMATIC FRACTURE;  Surgeon: Ascencion Dike, MD;  Location: [facility];  Service: ENT;  Laterality: Right;  repair left lip laceration  . SALPINGECTOMY  2001   due to ectopic pregnancy.  Unsure of side.  . TUBAL LIGATION  2003     OB History    Gravida  7   Para  6   Term  6   Preterm      AB  1   Living  6     SAB      TAB      Ectopic  1   Multiple      Live Births  6           Family History  Problem Relation Age of Onset  . Diabetes Mother   . Hypertension Mother     Social History   Tobacco Use  . Smoking status: Never Smoker  . Smokeless tobacco: Never Used  Vaping Use  . Vaping Use: Never used  Substance Use Topics  . Alcohol use: Yes    Alcohol/week: 0.0 - 1.0 standard drinks  . Drug use: No    Home Medications Prior to Admission medications   Medication Sig Start Date End Date Taking? Authorizing Provider  ferrous sulfate 325 (65 FE) MG tablet Take 1 tablet (325 mg total) by mouth 2 (two) times daily with a meal. 04/03/19 06/02/19  Barb Merino, MD    Allergies    Patient has no known allergies.  Review of Systems   Review  of Systems  Constitutional: Positive for activity change, appetite change, fatigue and fever.  HENT: Negative for congestion and rhinorrhea.   Respiratory: Negative for cough and shortness of breath.   Cardiovascular: Negative for chest pain and leg swelling.  Gastrointestinal: Positive for abdominal pain. Negative for nausea and vomiting.  Genitourinary: Negative for dysuria, hematuria, vaginal bleeding and vaginal discharge.  Musculoskeletal: Positive for back pain. Negative for arthralgias and myalgias.  Neurological: Negative for dizziness, weakness and headaches.   all other systems are negative except as noted in the HPI and PMH.    Physical Exam Updated Vital Signs BP (!) 159/112   Pulse (!) 101   Temp (!) 100.8 F (38.2 C) (Oral)   Resp 18   Ht 5\' 2"  (1.575 m)   Wt 89.8 kg   SpO2 100%   BMI 36.21 kg/m   Physical Exam Vitals and nursing note reviewed.    Constitutional:      General: She is not in acute distress.    Appearance: She is well-developed.     Comments: Uncomfortable  HENT:     Head: Normocephalic and atraumatic.     Mouth/Throat:     Pharynx: No oropharyngeal exudate.  Eyes:     Conjunctiva/sclera: Conjunctivae normal.     Pupils: Pupils are equal, round, and reactive to light.  Neck:     Comments: No meningismus. Cardiovascular:     Rate and Rhythm: Normal rate and regular rhythm.     Heart sounds: Normal heart sounds. No murmur heard.   Pulmonary:     Effort: Pulmonary effort is normal. No respiratory distress.     Breath sounds: Normal breath sounds.  Abdominal:     Palpations: Abdomen is soft.     Tenderness: There is abdominal tenderness. There is no guarding or rebound.     Comments: Soft abdomen, right upper quadrant and epigastric tenderness without guarding or rebound.  Musculoskeletal:        General: Tenderness present. Normal range of motion.     Cervical back: Normal range of motion and neck supple.     Comments: Right CVA tenderness  Skin:    General: Skin is warm.  Neurological:     Mental Status: She is alert and oriented to person, place, and time.     Cranial Nerves: No cranial nerve deficit.     Motor: No abnormal muscle tone.     Coordination: Coordination normal.     Comments:  5/5 strength throughout. CN 2-12 intact.Equal grip strength.   Psychiatric:        Behavior: Behavior normal.     ED Results / Procedures / Treatments   Labs (all labs ordered are listed, but only abnormal results are displayed) Labs Reviewed  COMPREHENSIVE METABOLIC PANEL - Abnormal; Notable for the following components:      Result Value   Potassium 3.1 (*)    Calcium 8.8 (*)    All other components within normal limits  CBC - Abnormal; Notable for the following components:   Hemoglobin 7.9 (*)    HCT 29.5 (*)    MCV 73.2 (*)    MCH 19.6 (*)    MCHC 26.8 (*)    RDW 21.6 (*)    All other components  within normal limits  URINALYSIS, ROUTINE W REFLEX MICROSCOPIC - Abnormal; Notable for the following components:   APPearance HAZY (*)    Hgb urine dipstick SMALL (*)    Protein, ur 30 (*)  Nitrite POSITIVE (*)    Leukocytes,Ua SMALL (*)    WBC, UA >50 (*)    Bacteria, UA RARE (*)    All other components within normal limits  URINE CULTURE  LIPASE, BLOOD  I-STAT BETA HCG BLOOD, ED ([facility], [facility], [facility] ONLY)    EKG None  Radiology CT Renal Stone Study  Result Date: 04/14/2020 CLINICAL DATA:  Right upper quadrant/flank pain. EXAM: CT ABDOMEN AND PELVIS WITHOUT CONTRAST TECHNIQUE: Multidetector CT imaging of the abdomen and pelvis was performed following the standard protocol without IV contrast. COMPARISON:  04/01/2019 CT abdomen and pelvis. 04/02/2019 pelvic ultrasound. FINDINGS: Lower chest: Unchanged left lower lobe scarring and 2-3 mm right lower lobe nodules. No airspace consolidation or pleural effusion. Hepatobiliary: Similar appearance of numerous low-density lesions throughout the liver measuring up to 2.2 cm in size and compatible with cysts. Unremarkable gallbladder. No biliary dilatation. Pancreas: Unremarkable. Spleen: The subcentimeter hypodense lesion anteriorly in the spleen on the prior CT is not well demonstrated on this unenhanced study. Adrenals/Urinary Tract: Unremarkable adrenal glands. Unchanged 1-2 mm calculus in the lower pole of the left kidney. No hydronephrosis. New mild right perinephric stranding. Unremarkable bladder. Stomach/Bowel: The stomach is unremarkable. There is no evidence of bowel obstruction or inflammation. The appendix is unremarkable. Vascular/Lymphatic: Normal caliber of the abdominal aorta. No enlarged lymph nodes. Reproductive: Chronically enlarged and lobular uterus consistent with known fibroids. 3.7 cm benign appearing right adnexal cyst, similar in size to the previous studies. Other: No intraperitoneal free fluid.  No pneumoperitoneum. Musculoskeletal:  No acute osseous abnormality or suspicious osseous lesion. IMPRESSION: 1. New mild nonspecific right perinephric stranding. Correlate with urinalysis to assess for urinary tract infection. 2. Unchanged punctate nonobstructing left renal calculus. 3. Uterine fibroids. Electronically Signed   By: Logan Bores M.D.   On: 04/14/2020 06:30    Procedures Procedures (including critical care time)  Medications Ordered in ED Medications  sodium chloride 0.9 % bolus 1,000 mL (has no administration in time range)  ondansetron (ZOFRAN) injection 4 mg (has no administration in time range)  fentaNYL (SUBLIMAZE) injection 50 mcg (has no administration in time range)  acetaminophen (TYLENOL) tablet 650 mg (650 mg Oral Given 04/13/20 2358)    ED Course  I have reviewed the triage vital signs and the nursing notes.  Pertinent labs & imaging results that were available during my care of the patient were reviewed by me and considered in my medical decision making (see chart for details).    MDM Rules/Calculators/A&P                         Right-sided upper abdominal pain and mid back pain for the past 5 days.  Found to be febrile on arrival. Vital stable.  UA concerning for infection.  Culture is sent.  Pyelonephritis considered but also need to consider infected kidney stone as well as gallbladder pathology. LFTs and lipase are normal.  IVF and rocephin given. Anemia is stable to previous. Hypokalemia repleted.   Patient appears well. Tolerating PO.  CT obtained to rule out infected kidney stone. No obstructing stone. RUQ [place] to be obtained to evaluate gallbladder.   Will treat for pyelonephritis.  RUQ [place] pending at shift change. Dr. Rex Kras to assume care.  Final Clinical Impression(s) / ED Diagnoses Final diagnoses:  RUQ pain  Pyelonephritis    Rx / DC Orders ED Discharge Orders    None       Kaytee Taliercio, Annie Main, MD  04/14/20 0729  

## 2020-04-14 NOTE — ED Provider Notes (Signed)
PT signed out pending imaging, had presented w/ fevers and R sided pain. CT and UA suggestive of pyelonephritis, RUQ ordered to r/o gallbladder pathology.  RUQ [place] normal. PT well appearing on reassessment. Had received CTX, given prescription for keflex. Discussed supportive measures. Extensively reviewed return precautions and he voiced understanding.    Addelynn Batte, Wenda Overland, MD 04/14/20 [phone]

## 2020-04-15 LAB — URINE CULTURE: Culture: >=100000 — AB

## 2020-04-15 NOTE — Progress Notes (Signed)
04/15/2020 247 pm TOC CM received referral for PCP needs and medications. Pt did pick up her meds. States she starts a new job on 04/27/2020 that will offer health insurance. Appt scheduled for [facility] on 05/11/2020 at 230 pm. Provided pt with contact information. ED provider updated. [place], [place] ED TOC CM [phone]

## 2020-04-16 ENCOUNTER — Telehealth: Payer: Self-pay | Admitting: *Deleted

## 2020-04-16 NOTE — Telephone Encounter (Signed)
Post ED Visit - Positive Culture Follow-up  Culture report reviewed by antimicrobial stewardship pharmacist: [facility] Team []  Elenor Quinones, Pharm.D. []  Heide Guile, Pharm.D., BCPS AQ-ID []  Parks Neptune, Pharm.D., BCPS []  Alycia Rossetti, Pharm.D., BCPS []  [place], Pharm.D., BCPS, AAHIVP []  Legrand Como, Pharm.D., BCPS, AAHIVP []  Salome Arnt, PharmD, BCPS []  Johnnette Gourd, PharmD, BCPS []  Hughes Better, PharmD, BCPS []  Leeroy Cha, PharmD []  Laqueta Linden, PharmD, BCPS []  Albertina Parr, PharmD  [facility] Team []  Leodis Sias, PharmD []  Lindell Spar, PharmD []  Royetta Asal, PharmD []  Graylin Shiver, Rph []  Rema Fendt) Glennon Mac, PharmD []  Arlyn Dunning, PharmD []  Netta Cedars, PharmD []  Dia Sitter, PharmD []  Leone Haven, PharmD []  Gretta Arab, PharmD []  Theodis Shove, PharmD []  Peggyann Juba, PharmD []  Reuel Boom, PharmD   Positive urine culture Treated with Cephalexin, organism sensitive to the same and no further patient follow-up is required at this time.  Harlon Flor [facility] 04/16/2020, 11:02 AM

## 2020-05-11 ENCOUNTER — Encounter: Payer: Self-pay | Admitting: Internal Medicine

## 2020-05-11 ENCOUNTER — Other Ambulatory Visit: Payer: Self-pay

## 2020-05-11 ENCOUNTER — Ambulatory Visit: Payer: Self-pay | Attending: Internal Medicine | Admitting: Internal Medicine

## 2020-05-11 VITALS — BP 157/108 | HR 80 | Temp 98.7°F | Resp 16 | Ht 62.0 in | Wt 210.0 lb

## 2020-05-11 DIAGNOSIS — D500 Iron deficiency anemia secondary to blood loss (chronic): Secondary | ICD-10-CM

## 2020-05-11 DIAGNOSIS — Z7689 Persons encountering health services in other specified circumstances: Secondary | ICD-10-CM

## 2020-05-11 DIAGNOSIS — D259 Leiomyoma of uterus, unspecified: Secondary | ICD-10-CM

## 2020-05-11 DIAGNOSIS — E876 Hypokalemia: Secondary | ICD-10-CM

## 2020-05-11 DIAGNOSIS — R030 Elevated blood-pressure reading, without diagnosis of hypertension: Secondary | ICD-10-CM

## 2020-05-11 NOTE — Patient Instructions (Addendum)
Please let me know once you have insurance so that we can submit the referral for a follow-up visit with the gynecologist.  Take the iron supplement twice a day.  Follow-up with the clinical pharmacist in 2 weeks for repeat blood pressure check.   DASH Eating Plan DASH stands for "Dietary Approaches to Stop Hypertension." The DASH eating plan is a healthy eating plan that has been shown to reduce high blood pressure (hypertension). It may also reduce your risk for type 2 diabetes, heart disease, and stroke. The DASH eating plan may also help with weight loss. What are tips for following this plan?  General guidelines  Avoid eating more than 2,300 mg (milligrams) of salt (sodium) a day. If you have hypertension, you may need to reduce your sodium intake to 1,500 mg a day.  Limit alcohol intake to no more than 1 drink a day for nonpregnant women and 2 drinks a day for men. One drink equals 12 oz of beer, 5 oz of wine, or 1 oz of hard liquor.  Work with your health care provider to maintain a healthy body weight or to lose weight. Ask what an ideal weight is for you.  Get at least 30 minutes of exercise that causes your heart to beat faster (aerobic exercise) most days of the week. Activities may include walking, swimming, or biking.  Work with your health care provider or diet and nutrition specialist (dietitian) to adjust your eating plan to your individual calorie needs. Reading food labels   Check food labels for the amount of sodium per serving. Choose foods with less than 5 percent of the Daily Value of sodium. Generally, foods with less than 300 mg of sodium per serving fit into this eating plan.  To find whole grains, look for the word "whole" as the first word in the ingredient list. Shopping  Buy products labeled as "low-sodium" or "no salt added."  Buy fresh foods. Avoid canned foods and premade or frozen meals. Cooking  Avoid adding salt when cooking. Use salt-free  seasonings or herbs instead of table salt or sea salt. Check with your health care provider or pharmacist before using salt substitutes.  Do not fry foods. Cook foods using healthy methods such as baking, boiling, grilling, and broiling instead.  Cook with heart-healthy oils, such as olive, canola, soybean, or sunflower oil. Meal planning  Eat a balanced diet that includes: ? 5 or more servings of fruits and vegetables each day. At each meal, try to fill half of your plate with fruits and vegetables. ? Up to 6-8 servings of whole grains each day. ? Less than 6 oz of lean meat, poultry, or fish each day. A 3-oz serving of meat is about the same size as a deck of cards. One egg equals 1 oz. ? 2 servings of low-fat dairy each day. ? A serving of nuts, seeds, or beans 5 times each week. ? Heart-healthy fats. Healthy fats called Omega-3 fatty acids are found in foods such as flaxseeds and coldwater fish, like sardines, salmon, and mackerel.  Limit how much you eat of the following: ? Canned or prepackaged foods. ? Food that is high in trans fat, such as fried foods. ? Food that is high in saturated fat, such as fatty meat. ? Sweets, desserts, sugary drinks, and other foods with added sugar. ? Full-fat dairy products.  Do not salt foods before eating.  Try to eat at least 2 vegetarian meals each week.  Eat more home-cooked  food and less restaurant, buffet, and fast food.  When eating at a restaurant, ask that your food be prepared with less salt or no salt, if possible. What foods are recommended? The items listed may not be a complete list. Talk with your dietitian about what dietary choices are best for you. Grains Whole-grain or whole-wheat bread. Whole-grain or whole-wheat pasta. Brown rice. [place]. Bulgur. Whole-grain and low-sodium cereals. Pita bread. Low-fat, low-sodium crackers. Whole-wheat flour tortillas. Vegetables Fresh or frozen vegetables (raw, steamed, roasted, or  grilled). Low-sodium or reduced-sodium tomato and vegetable juice. Low-sodium or reduced-sodium tomato sauce and tomato paste. Low-sodium or reduced-sodium canned vegetables. Fruits All fresh, dried, or frozen fruit. Canned fruit in natural juice (without added sugar). Meat and other protein foods Skinless chicken or [place]. Ground chicken or [place]. Pork with fat trimmed off. Fish and seafood. Egg whites. Dried beans, peas, or lentils. Unsalted nuts, nut butters, and seeds. Unsalted canned beans. Lean cuts of beef with fat trimmed off. Low-sodium, lean deli meat. Dairy Low-fat (1%) or fat-free (skim) milk. Fat-free, low-fat, or reduced-fat cheeses. Nonfat, low-sodium ricotta or cottage cheese. Low-fat or nonfat yogurt. Low-fat, low-sodium cheese. Fats and oils Soft margarine without trans fats. Vegetable oil. Low-fat, reduced-fat, or light mayonnaise and salad dressings (reduced-sodium). Canola, safflower, olive, soybean, and sunflower oils. Avocado. Seasoning and other foods Herbs. Spices. Seasoning mixes without salt. Unsalted popcorn and pretzels. Fat-free sweets. What foods are not recommended? The items listed may not be a complete list. Talk with your dietitian about what dietary choices are best for you. Grains Baked goods made with fat, such as croissants, muffins, or some breads. Dry pasta or rice meal packs. Vegetables Creamed or fried vegetables. Vegetables in a cheese sauce. Regular canned vegetables (not low-sodium or reduced-sodium). Regular canned tomato sauce and paste (not low-sodium or reduced-sodium). Regular tomato and vegetable juice (not low-sodium or reduced-sodium). Angie Fava. Olives. Fruits Canned fruit in a light or heavy syrup. Fried fruit. Fruit in cream or butter sauce. Meat and other protein foods Fatty cuts of meat. Ribs. Fried meat. Berniece Salines. Sausage. Bologna and other processed lunch meats. Salami. Fatback. Hotdogs. Bratwurst. Salted nuts and seeds. Canned beans with  added salt. Canned or smoked fish. Whole eggs or egg yolks. Chicken or [place] with skin. Dairy Whole or 2% milk, cream, and half-and-half. Whole or full-fat cream cheese. Whole-fat or sweetened yogurt. Full-fat cheese. Nondairy creamers. Whipped toppings. Processed cheese and cheese spreads. Fats and oils Butter. Stick margarine. Lard. Shortening. Ghee. Bacon fat. Tropical oils, such as coconut, palm kernel, or palm oil. Seasoning and other foods Salted popcorn and pretzels. Onion salt, garlic salt, seasoned salt, table salt, and sea salt. Worcestershire sauce. Tartar sauce. Barbecue sauce. Teriyaki sauce. Soy sauce, including reduced-sodium. Steak sauce. Canned and packaged gravies. Fish sauce. Oyster sauce. Cocktail sauce. Horseradish that you find on the shelf. Ketchup. Mustard. Meat flavorings and tenderizers. Bouillon cubes. Hot sauce and Tabasco sauce. Premade or packaged marinades. Premade or packaged taco seasonings. Relishes. Regular salad dressings. Where to find more information:  National Heart, Lung, and [place]: https://wilson-eaton.com/  American Heart Association: www.heart.org Summary  The DASH eating plan is a healthy eating plan that has been shown to reduce high blood pressure (hypertension). It may also reduce your risk for type 2 diabetes, heart disease, and stroke.  With the DASH eating plan, you should limit salt (sodium) intake to 2,300 mg a day. If you have hypertension, you may need to reduce your sodium intake to 1,500 mg  a day.  When on the DASH eating plan, aim to eat more fresh fruits and vegetables, whole grains, lean proteins, low-fat dairy, and heart-healthy fats.  Work with your health care provider or diet and nutrition specialist (dietitian) to adjust your eating plan to your individual calorie needs. This information is not intended to replace advice given to you by your health care provider. Make sure you discuss any questions you have with your health care  provider. Document Revised: 08/25/2017 Document Reviewed: 09/05/2016 Elsevier Patient Education  2020 [employer].

## 2020-05-11 NOTE — Progress Notes (Signed)
Patient ID: Gabrielle Roberts, female    DOB: 1971-03-20  MRN: [id number]  CC: Hospitalization Follow-up (ED)   Subjective: Gabrielle Roberts is a 49 y.o. female who presents for new pt visit and ER follow-up.  Her concerns today include:  Patient with history of thrombocytosis, microcytic anemia, uterine fibroids, lower GI bleed  Patient presents to establish care and as a follow-up from the emergency room.  She was seen in the ER 04/14/2020 with fever and right upper abdominal pain.  UA was positive.  Culture subsequently grew E. coli.  Right upper quadrant ultrasound was negative.  CAT scan stone protocol revealed new mild nonspecific right perinephritic stranding.  It also showed left nonobstructing renal calculus and uterine fibroids.  H&H was 7.9/25.5 with MCV of 73.  She was assessed to have pyelonephritis.  She was treated with Keflex antibiotics.  Bacteria was sensitive to this. -Today she reports that all symptoms have resolved with completion of antibiotics.  Pt with hx of IDA.  Restarted Iron supplement after ER visit.  Taking once a day. Also taking One A day Women MV once a day Hx of fibroids.  Menses last 4 days, heavy first 2 days.  Has to change super tampon and maxi pad Q 2 hrs. No dizziness.  Some tiredness which she attributes to shift changes. -Dr. Hale Bogus 03/2019.  Loss insurance.  Has new job and will be eligible for insurance soon.  She had seen gynecology last year, Dr. Hale Bogus.  They had discussed putting her on birth control versus doing a myomectomy versus a hysterectomy.  BP elevated today.  She reports no prior history of hypertension. Does not use too much salt.  No CP/SOB/LE edema  Past medical, surgical, family history and social history reviewed and updated. Patient Active Problem List   Diagnosis Date Noted   Lower GI bleed 04/01/2019   Symptomatic anemia 04/01/2019   Elevated blood pressure reading 04/01/2019   Thrombocytosis ([place]) 04/01/2019      Current Outpatient Medications on File Prior to Visit  Medication Sig Dispense Refill   acetaminophen (TYLENOL) 325 MG tablet Take 650 mg by mouth every 6 (six) hours as needed for mild pain or headache. (Patient not taking: Reported on 05/11/2020)     cephALEXin (KEFLEX) 500 MG capsule Take 1 capsule (500 mg total) by mouth 3 (three) times daily. (Patient not taking: Reported on 05/11/2020) 30 capsule 0   ferrous sulfate 325 (65 FE) MG tablet Take 1 tablet (325 mg total) by mouth 2 (two) times daily with a meal. 30 tablet 3   ibuprofen (ADVIL) 600 MG tablet Take 1 tablet (600 mg total) by mouth every 6 (six) hours as needed. (Patient not taking: Reported on 05/11/2020) 30 tablet 0   ondansetron (ZOFRAN ODT) 4 MG disintegrating tablet Take 1 tablet (4 mg total) by mouth every 8 (eight) hours as needed for nausea or vomiting. (Patient not taking: Reported on 05/11/2020) 20 tablet 0   No current facility-administered medications on file prior to visit.    No Known Allergies  Social History   Socioeconomic History   Marital status: Single    Spouse name: Not on file   Number of children: 6   Years of education: 12 grade   Highest education level: Not on file  Occupational History   Occupation: Glass blower/designer  Tobacco Use   Smoking status: Never Smoker   Smokeless tobacco: Never Used  Scientific laboratory technician Use: Never used  Substance and Sexual Activity   Alcohol use: Yes    Comment: occasionally   Drug use: No   Sexual activity: Not Currently    Birth control/protection: Condom  Other Topics Concern   Not on file  Social History Narrative   Not on file   Social Determinants of Health   Financial Resource Strain:    Difficulty of Paying Living Expenses:   Food Insecurity:    Worried About Charity fundraiser in the Last Year:    Arboriculturist in the Last Year:   Transportation Needs:    Film/video editor (Medical):    Lack of Transportation  (Non-Medical):   Physical Activity:    Days of Exercise per Week:    Minutes of Exercise per Session:   Stress:    Feeling of Stress :   Social Connections:    Frequency of Communication with Friends and Family:    Frequency of Social Gatherings with Friends and Family:    Attends Religious Services:    Active Member of Clubs or Organizations:    Attends Music therapist:    Marital Status:   Intimate Partner Violence:    Fear of Current or Ex-Partner:    Emotionally Abused:    Physically Abused:    Sexually Abused:     Family History  Problem Relation Age of Onset   Diabetes Mother    Hypertension Mother    Diabetes Son     Past Surgical History:  Procedure Laterality Date   COLONOSCOPY WITH PROPOFOL N/A 04/03/2019   Procedure: COLONOSCOPY WITH PROPOFOL;  Surgeon: Laurence Spates, MD;  Location: [facility];  Service: Gastroenterology;  Laterality: N/A;   ORIF ZYGOMATIC FRACTURE  01/27/2012   Procedure: OPEN REDUCTION INTERNAL FIXATION (ORIF) ZYGOMATIC FRACTURE;  Surgeon: Ascencion Dike, MD;  Location: [facility];  Service: ENT;  Laterality: Right;  repair left lip laceration   SALPINGECTOMY  2001   due to ectopic pregnancy.  Unsure of side.   TUBAL LIGATION  2003    ROS: Review of Systems Negative except as stated above  PHYSICAL EXAM: BP (!) 157/108    Pulse 80    Temp 98.7 F (37.1 C)    Resp 16    Ht 5\' 2"  (1.575 m)    Wt 210 lb (95.3 kg)    SpO2 100%    BMI 38.41 kg/m   Physical Exam  General appearance - alert, well appearing, and in no distress Mental status - normal mood, behavior, speech, dress, motor activity, and thought processes Eyes - pupils equal and reactive, extraocular eye movements intact Mouth - mucous membranes moist, pharynx normal without lesions Neck - supple, no significant adenopathy Chest - clear to auscultation, no wheezes, rales or rhonchi, symmetric air entry Heart - normal rate, regular rhythm, normal S1, S2,  no murmurs, rubs, clicks or gallops Abdomen - soft, nontender, nondistended, no masses or organomegaly Extremities - peripheral pulses normal, no pedal edema, no clubbing or cyanosis   CMP Latest Ref Rng & Units 04/14/2020 04/03/2019 04/02/2019  Glucose 70 - 99 mg/dL 94 83 90  BUN 6 - 20 mg/dL 10 6 8   Creatinine 0.44 - 1.00 mg/dL 0.66 0.65 0.58  Sodium 135 - 145 mmol/L 136 138 136  Potassium 3.5 - 5.1 mmol/L 3.1(L) 3.6 3.6  Chloride 98 - 111 mmol/L 102 107 107  CO2 22 - 32 mmol/L 26 23 23   Calcium 8.9 - 10.3 mg/dL 8.8(L) 9.3 8.7(L)  Total Protein 6.5 - 8.1 g/dL 8.1 - 6.9  Total Bilirubin 0.3 - 1.2 mg/dL 0.6 - 0.9  Alkaline Phos 38 - 126 U/L 44 - 31(L)  AST 15 - 41 U/L 16 - 14(L)  ALT 0 - 44 U/L 10 - 7   Lipid Panel  No results found for: CHOL, TRIG, HDL, CHOLHDL, VLDL, LDLCALC, LDLDIRECT  CBC    Component Value Date/Time   WBC 10.4 04/14/2020 0005   RBC 4.03 04/14/2020 0005   HGB 7.9 (L) 04/14/2020 0005   HCT 29.5 (L) 04/14/2020 0005   PLT 304 04/14/2020 0005   MCV 73.2 (L) 04/14/2020 0005   MCH 19.6 (L) 04/14/2020 0005   MCHC 26.8 (L) 04/14/2020 0005   RDW 21.6 (H) 04/14/2020 0005   LYMPHSABS 1.5 07/10/2014 1159   MONOABS 0.5 07/10/2014 1159   EOSABS 0.0 07/10/2014 1159   BASOSABS 0.0 07/10/2014 1159    ASSESSMENT AND PLAN:  1. Establishing care with new doctor, encounter for  2. Iron deficiency anemia due to chronic blood loss Secondary to heavy menses with known fibroids.  We will plan to refer her back to Dr. Sabra Heck once she is signed up for insurance through her employer.  She will let me know when she has insurance.  She has had colonoscopy last year. -Advised to continue iron supplement over-the-counter.  Recommend taking it twice a day. - CBC  3. Uterine leiomyoma, unspecified location Plan to refer her back to Dr. Sabra Heck once she has insurance within the next several weeks  4. Elevated blood-pressure reading without diagnosis of hypertension DASH diet  discussed and encouraged.  Printed information given.  Follow-up with clinical pharmacist in 2 weeks for repeat blood pressure check.  If still elevated I would recommend starting her on amlodipine  5. Hypokalemia - Basic Metabolic Panel  6. Hypocalcemia - Basic Metabolic Panel    Patient was given the opportunity to ask questions.  Patient verbalized understanding of the plan and was able to repeat key elements of the plan.   Orders Placed This Encounter  Procedures   Basic Metabolic Panel   CBC     Requested Prescriptions    No prescriptions requested or ordered in this encounter    Return in about 2 months (around 07/11/2020) for f/u [facility] in 2 wks for blood pressure recheck.  Karle Plumber, MD, FACP

## 2020-05-12 LAB — CBC
Hematocrit: 28.9 % — ABNORMAL LOW (ref 34.0–46.6)
Hemoglobin: 7.7 g/dL — ABNORMAL LOW (ref 11.1–15.9)
MCH: 18.6 pg — ABNORMAL LOW (ref 26.6–33.0)
MCHC: 26.6 g/dL — ABNORMAL LOW (ref 31.5–35.7)
MCV: 70 fL — ABNORMAL LOW (ref 79–97)
Platelets: 214 10*3/uL (ref 150–450)
RBC: 4.13 x10E6/uL (ref 3.77–5.28)
RDW: 18.8 % — ABNORMAL HIGH (ref 11.7–15.4)
WBC: 6.4 10*3/uL (ref 3.4–10.8)

## 2020-05-12 LAB — BASIC METABOLIC PANEL
BUN/Creatinine Ratio: 12 (ref 9–23)
BUN: 10 mg/dL (ref 6–24)
CO2: 24 mmol/L (ref 20–29)
Calcium: 9.1 mg/dL (ref 8.7–10.2)
Chloride: 104 mmol/L (ref 96–106)
Creatinine, Ser: 0.81 mg/dL (ref 0.57–1.00)
GFR calc Af Amer: 100 mL/min/{1.73_m2} (ref >59)
GFR calc non Af Amer: 87 mL/min/{1.73_m2} (ref >59)
Glucose: 96 mg/dL (ref 65–99)
Potassium: 3.6 mmol/L (ref 3.5–5.2)
Sodium: 143 mmol/L (ref 134–144)

## 2020-05-13 ENCOUNTER — Other Ambulatory Visit: Payer: Self-pay | Admitting: Internal Medicine

## 2020-05-13 ENCOUNTER — Telehealth: Payer: Self-pay

## 2020-05-13 MED ORDER — FERROUS SULFATE 325 (65 FE) MG PO TABS: 325.0000 mg | ORAL_TABLET | Freq: Two times a day (BID) | ORAL | 3 refills | Status: DC

## 2020-05-13 NOTE — Progress Notes (Signed)
Let patient know that her repeat potassium and calcium level are normal.  Kidney function normal.  Still very anemic.  I will send a prescription for iron supplement to [facility] in [facility].  I recommend taking the medication twice a day.  Please let me know when she has insurance so that we can submit the referral for her to see the gynecologist Dr. Sabra Heck again to address the heavy vaginal bleeding from fibroids.

## 2020-05-13 NOTE — Telephone Encounter (Signed)
Contacted pt to go over lab results pt didn't answer lvm asking pt to give a call back  °

## 2020-05-25 ENCOUNTER — Ambulatory Visit: Payer: Self-pay | Admitting: Pharmacist

## 2020-07-13 ENCOUNTER — Other Ambulatory Visit: Payer: Self-pay

## 2020-07-13 ENCOUNTER — Ambulatory Visit: Payer: Self-pay | Attending: Internal Medicine | Admitting: Internal Medicine

## 2021-04-01 ENCOUNTER — Encounter (HOSPITAL_COMMUNITY): Payer: Self-pay | Admitting: Internal Medicine

## 2021-04-01 ENCOUNTER — Observation Stay (HOSPITAL_COMMUNITY)
Admission: EM | Admit: 2021-04-01 | Discharge: 2021-04-03 | DRG: 760 | Disposition: A | Discharge status: Home / Self Care | Payer: BC Managed Care – PPO | Attending: Family Medicine | Admitting: Family Medicine

## 2021-04-01 ENCOUNTER — Other Ambulatory Visit: Payer: Self-pay

## 2021-04-01 DIAGNOSIS — D696 Thrombocytopenia, unspecified: Secondary | ICD-10-CM | POA: Diagnosis not present

## 2021-04-01 DIAGNOSIS — R42 Dizziness and giddiness: Secondary | ICD-10-CM | POA: Diagnosis not present

## 2021-04-01 DIAGNOSIS — Z20822 Contact with and (suspected) exposure to covid-19: Secondary | ICD-10-CM | POA: Insufficient documentation

## 2021-04-01 DIAGNOSIS — N922 Excessive menstruation at puberty: Secondary | ICD-10-CM

## 2021-04-01 DIAGNOSIS — D649 Anemia, unspecified: Secondary | ICD-10-CM | POA: Diagnosis present

## 2021-04-01 DIAGNOSIS — D500 Iron deficiency anemia secondary to blood loss (chronic): Secondary | ICD-10-CM

## 2021-04-01 DIAGNOSIS — D509 Iron deficiency anemia, unspecified: Principal | ICD-10-CM | POA: Insufficient documentation

## 2021-04-01 DIAGNOSIS — N920 Excessive and frequent menstruation with regular cycle: Secondary | ICD-10-CM | POA: Diagnosis present

## 2021-04-01 HISTORY — DX: Excessive and frequent menstruation with regular cycle: N92.0

## 2021-04-01 LAB — CBC
HCT: 28 % — ABNORMAL LOW (ref 36.0–46.0)
Hemoglobin: 7.6 g/dL — ABNORMAL LOW (ref 12.0–15.0)
Hemoglobin: 7.2 g/dL — ABNORMAL LOW (ref 12.0–15.0)
MCH: 18 pg — ABNORMAL LOW (ref 26.0–34.0)
MCHC: 25.7 g/dL — ABNORMAL LOW (ref 30.0–36.0)
MCV: 70.2 fL — ABNORMAL LOW (ref 80.0–100.0)
Platelets: 89 10*3/uL — ABNORMAL LOW (ref 150–400)
Platelets: 77 10*3/uL — ABNORMAL LOW (ref 150–400)
RBC: 3.99 MIL/uL (ref 3.87–5.11)
RDW: 29.2 % — ABNORMAL HIGH (ref 11.5–15.5)
WBC: 6.2 10*3/uL (ref 4.0–10.5)
WBC: 6 10*3/uL (ref 4.0–10.5)
nRBC: 0 % (ref 0.0–0.2)
nRBC: 0 % (ref 0.0–0.2)

## 2021-04-01 LAB — COMPREHENSIVE METABOLIC PANEL
ALT: 11 U/L (ref 0–44)
AST: 20 U/L (ref 15–41)
Albumin: 3.7 g/dL (ref 3.5–5.0)
Alkaline Phosphatase: 43 U/L (ref 38–126)
Anion gap: 7 (ref 5–15)
BUN: 8 mg/dL (ref 6–20)
CO2: 26 mmol/L (ref 22–32)
Calcium: 9 mg/dL (ref 8.9–10.3)
Chloride: 104 mmol/L (ref 98–111)
Creatinine, Ser: 0.63 mg/dL (ref 0.44–1.00)
GFR, Estimated: >60 mL/min (ref >60)
Glucose, Bld: 92 mg/dL (ref 70–99)
Potassium: 4 mmol/L (ref 3.5–5.1)
Sodium: 137 mmol/L (ref 135–145)
Total Bilirubin: 0.4 mg/dL (ref 0.3–1.2)
Total Protein: 7.2 g/dL (ref 6.5–8.1)

## 2021-04-01 LAB — I-STAT CHEM 8, ED
BUN: 5 mg/dL — ABNORMAL LOW (ref 6–20)
Calcium, Ion: 1.22 mmol/L (ref 1.15–1.40)
Chloride: 104 mmol/L (ref 98–111)
Creatinine, Ser: 0.7 mg/dL (ref 0.44–1.00)
Glucose, Bld: 96 mg/dL (ref 70–99)
HCT: 28 % — ABNORMAL LOW (ref 36.0–46.0)
Hemoglobin: 9.5 g/dL — ABNORMAL LOW (ref 12.0–15.0)
Potassium: 3.9 mmol/L (ref 3.5–5.1)
Sodium: 139 mmol/L (ref 135–145)
TCO2: 25 mmol/L (ref 22–32)

## 2021-04-01 LAB — PREPARE RBC (CROSSMATCH)

## 2021-04-01 MED ORDER — ACETAMINOPHEN 650 MG RE SUPP: 650.0000 mg | Freq: Four times a day (QID) | RECTAL | Status: DC | PRN

## 2021-04-01 MED ORDER — ACETAMINOPHEN 325 MG PO TABS: 650.0000 mg | ORAL_TABLET | Freq: Four times a day (QID) | ORAL | Status: DC | PRN

## 2021-04-01 MED ORDER — FERROUS SULFATE 325 (65 FE) MG PO TABS
325.0000 mg | ORAL_TABLET | Freq: Two times a day (BID) | ORAL | Status: DC
  Administered 2021-04-02 – 2021-04-03 (×3): 325 mg via ORAL
  Filled 2021-04-01 (×3): qty 1

## 2021-04-01 MED ORDER — NAPROXEN 250 MG PO TABS
500.0000 mg | ORAL_TABLET | Freq: Once | ORAL | Status: AC
  Administered 2021-04-01: 500 mg via ORAL
  Filled 2021-04-01: qty 2

## 2021-04-01 MED ORDER — SODIUM CHLORIDE 0.9 % IV BOLUS
1000.0000 mL | Freq: Once | INTRAVENOUS | Status: AC
  Administered 2021-04-01: 1000 mL via INTRAVENOUS

## 2021-04-01 MED ORDER — SODIUM CHLORIDE 0.9% IV SOLUTION: Freq: Once | INTRAVENOUS | Status: AC

## 2021-04-01 NOTE — ED Notes (Signed)
EDP into room. Pt alert, NAD, calm, interactive, resps e/u, speaking in clear complete sentences. Denies pain, sob, nausea. HA resolved/gone. Admits to some dizziness remains. "feel better".

## 2021-04-01 NOTE — H&P (Signed)
History and Physical    PLEASE NOTE THAT DRAGON DICTATION SOFTWARE WAS USED IN THE CONSTRUCTION OF THIS NOTE.   [name] XBD:[id number] DOB: 1971/05/09 DOA: 04/01/2021  PCP: Pcp, No Patient coming from: home   I have personally briefly reviewed patient's old medical records in [place]  Chief Complaint: Dizziness  HPI: Gabrielle Roberts is a 50 y.o. female with medical history significant for chronic iron deficiency anemia, menorrhagia, uterine fibroids, who is admitted to [facility] on 04/01/2021 with acute on chronic blood loss anemia after presenting from home to [facility] ED complaining of dizziness.   The patient reports 3 to 4 days of dizziness in the absence of presyncope or syncope.  He conveys that the dizziness has been nearly constant over that timeframe, but denies any associated vertigo, nausea, vomiting.  She denies any associated chest pain, shortness of breath, diaphoresis, palpitations.  Not associated with any recent trauma or fall.  Denies any recent abdominal pain, melena or hematochezia.  She also denies any acute dysuria, gross hematuria, or change in urinary urgency/frequency.  No recent subjective fever, chills, rigors, or generalized myalgias.  Denies any recent headache, neck stiffness, sore throat, wheezing, cough, diarrhea, or rash.  No recent traveling or known COVID-19 exposures.  Denies any recent orthopnea, PND, new onset peripheral edema, calf tenderness, or new lower extremity erythema.  She knowledges a history of chronic iron deficiency anemia in the setting of menorrhagia.  She notes the first day of her last menstrual cycle was 2-1/2 weeks ago and was associated with her typical 2 to 2-1/2 days of heavy flow followed by dissipating flow over the next 1 to 2 days.  She confirms no additional vaginal leading over the last 2 weeks.  She conveys a known history of uterine fibroids as previously diagnosed by her outpatient gynecologist.  The patient  reports it was previously the plan of her gynecologist to initiate oral contraceptives or ultimately pursue hysterectomy as management of her menorrhagia leading to acute on chronic anemia.  However, she notes that she was able to most recently follow-up with her gynecologist approximately 1.5 years ago, but notes that she has been unable to see her gynecologist since that time due concern for financial implications after losing her job in the interval.  However, she reports that she is recently become employed again, and that she is interested in following up with her gynecologist in order to further evaluate and manage her uterine fibroids as suspected source of her menorrhagia.   Patient denies any use of blood thinners as an outpatient, including the use of aspirin.  She conveys that she is on daily oral iron supplementation.  Denies any recent hemoptysis or hematemesis.  She acknowledges a history of acute lower gastrointestinal bleed in July 2020, at which time she underwent colonoscopy.  She denies any interval or recent melena or hematochezia.  Per chart review, the patient has a history of chronic anemia with baseline hemoglobin of 8-9, with most recent prior hemoglobin value noted to be 7.7 in August 2021,    ED Course:  Vital signs in the ED were notable for the following:  - Tetramex 98.6; heart rate 53-70; blood pressure 114/73 146/89; respiratory rate 15-20, oxygen saturation 96 to 100% on room air.  Labs were notable for the following: CMP was notable for the following: Sodium 137, potassium 4.0, BUN 8, creatinine 0.63, glucose 92, and liver enzymes were found to be within normal limits, including total  bilirubin noted to be 0.4.  CBC notable for white blood cell count of 6000, hemoglobin 7.2 associated with microcytic, hypochromic findings as well as elevated RDW, platelets 77 relative to most recent prior value of 214 in August 2021.  Nasopharyngeal COVID-19/influenza PCR performed in  the ED this evening, with result currently pending.  EKG shows sinus rhythm with heart rate 66, normal intervals, and no evidence of T wave or ST changes.  Type and screen was performed in the ED, and order was placed for transfusion of 1 unit PRBC.  While in the emergency department, she also received a 1 L normal saline bolus.     Review of Systems: As per HPI otherwise 10 point review of systems negative.   Past Medical History:  Diagnosis Date   Anemia     Past Surgical History:  Procedure Laterality Date   COLONOSCOPY WITH PROPOFOL N/A 04/03/2019   Procedure: COLONOSCOPY WITH PROPOFOL;  Surgeon: Laurence Spates, MD;  Location: [facility] ENDOSCOPY;  Service: Gastroenterology;  Laterality: N/A;   ORIF ZYGOMATIC FRACTURE  01/27/2012   Procedure: OPEN REDUCTION INTERNAL FIXATION (ORIF) ZYGOMATIC FRACTURE;  Surgeon: Ascencion Dike, MD;  Location: [facility] ORS;  Service: ENT;  Laterality: Right;  repair left lip laceration   SALPINGECTOMY  2001   due to ectopic pregnancy.  Unsure of side.   TUBAL LIGATION  2003    Social History:  reports that she has never smoked. She has never used smokeless tobacco. She reports current alcohol use. She reports that she does not use drugs.   No Known Allergies  Family History  Problem Relation Age of Onset   Diabetes Mother    Hypertension Mother    Diabetes Son     Family history reviewed and not pertinent    Prior to Admission medications   Medication Sig Start Date End Date Taking? Authorizing Provider  ferrous sulfate 325 (65 FE) MG tablet Take 1 tablet (325 mg total) by mouth 2 (two) times daily with a meal. 05/13/20  Yes Ladell Pier, MD  ibuprofen (ADVIL) 600 MG tablet Take 1 tablet (600 mg total) by mouth every 6 (six) hours as needed. Patient not taking: No sig reported 04/14/20   Rancour, Annie Main, MD  ondansetron (ZOFRAN ODT) 4 MG disintegrating tablet Take 1 tablet (4 mg total) by mouth every 8 (eight) hours as needed for nausea or  vomiting. Patient not taking: No sig reported 04/14/20   Ezequiel Essex, MD     Objective    Physical Exam: Vitals:   04/01/21 1824 04/01/21 1915 04/01/21 2153 04/01/21 2256  BP: 137/71 110/82 (!) 146/89 124/78  Pulse: 71 (!) 53 70 68  Resp: (!) 22 15 20 14   Temp:    98.1 F (36.7 C)  TempSrc:    Oral  SpO2: 96% 100% 99% 100%    General: appears to be stated age; alert, oriented Skin: warm, dry, no rash Head:  AT/[place] Mouth:  Oral mucosa membranes appear dry, normal dentition Neck: supple; trachea midline Heart:  RRR; did not appreciate any M/R/G Lungs: CTAB, did not appreciate any wheezes, rales, or rhonchi Abdomen: + BS; soft, ND, NT Vascular: 2+ pedal pulses b/l; 2+ radial pulses b/l Extremities: no peripheral edema, no muscle wasting Neuro: strength and sensation intact in upper and lower extremities b/l    Labs on Admission: I have personally reviewed following labs and imaging studies  CBC: Recent Labs  Lab 04/01/21 0207 04/01/21 1618 04/01/21 2124  WBC  --  6.2 6.0  HGB 9.5* 7.6* 7.2*  HCT 28.0* RESULTS UNAVAILABLE DUE TO INTERFERING SUBSTANCE 28.0*  MCV  --  RESULTS UNAVAILABLE DUE TO INTERFERING SUBSTANCE 70.2*  PLT  --  89* 77*   Basic Metabolic Panel: Recent Labs  Lab 04/01/21 0207 04/01/21 1618  NA 139 137  K 3.9 4.0  CL 104 104  CO2  --  26  GLUCOSE 96 92  BUN 5* 8  CREATININE 0.70 0.63  CALCIUM  --  9.0   GFR: CrCl cannot be calculated (Unknown ideal weight.). Liver Function Tests: Recent Labs  Lab 04/01/21 1618  AST 20  ALT 11  ALKPHOS 43  BILITOT 0.4  PROT 7.2  ALBUMIN 3.7   No results for input(s): LIPASE, AMYLASE in the last 168 hours. No results for input(s): AMMONIA in the last 168 hours. Coagulation Profile: No results for input(s): INR, PROTIME in the last 168 hours. Cardiac Enzymes: No results for input(s): CKTOTAL, CKMB, CKMBINDEX, TROPONINI in the last 168 hours. BNP (last 3 results) No results for input(s):  PROBNP in the last 8760 hours. HbA1C: No results for input(s): HGBA1C in the last 72 hours. CBG: No results for input(s): GLUCAP in the last 168 hours. Lipid Profile: No results for input(s): CHOL, HDL, LDLCALC, TRIG, CHOLHDL, LDLDIRECT in the last 72 hours. Thyroid Function Tests: No results for input(s): TSH, T4TOTAL, FREET4, T3FREE, THYROIDAB in the last 72 hours. Anemia Panel: No results for input(s): VITAMINB12, FOLATE, FERRITIN, TIBC, IRON, RETICCTPCT in the last 72 hours. Urine analysis:    Component Value Date/Time   COLORURINE YELLOW 04/14/2020 0230   APPEARANCEUR HAZY (A) 04/14/2020 0230   LABSPEC 1.016 04/14/2020 0230   PHURINE 6.0 04/14/2020 0230   GLUCOSEU NEGATIVE 04/14/2020 0230   HGBUR SMALL (A) 04/14/2020 0230   BILIRUBINUR NEGATIVE 04/14/2020 0230   KETONESUR NEGATIVE 04/14/2020 0230   PROTEINUR 30 (A) 04/14/2020 0230   UROBILINOGEN 0.2 07/10/2014 1152   NITRITE POSITIVE (A) 04/14/2020 0230   LEUKOCYTESUR SMALL (A) 04/14/2020 0230    Radiological Exams on Admission: No results found.   EKG: Independently reviewed, with result as described above.    Assessment/Plan   Gabrielle Roberts is a 50 y.o. female with medical history significant for chronic iron deficiency anemia, menorrhagia, uterine fibroids, who is admitted to [facility] on 04/01/2021 with acute on chronic blood loss anemia after presenting from home to [facility] ED complaining of dizziness.    Principal Problem:   Acute on chronic anemia Active Problems:   Menorrhagia   Thrombocytopenia (HCC)   Dizziness     #) Acute on chronic blood loss anemia: In the setting of a documented history of chronic iron deficiency anemia, associated with baseline hemoglobin of 8-9, presenting hemoglobin this evening found to be 7.2, which is relative to most recent prior value of 7.7 in August 2021.  Presenting hemoglobin 7.2 appears consistent with acute on chronic blood loss anemia in the setting of  menorrhagia, with this evening CBC reflecting microcytic/hypochromic findings as well as elevated RDW.  Patient appears hemodynamically stable at this time, and denies any associated chest pain or shortness of breath.  However, in the setting of symptomatic acute on chronic blood loss anemia in the setting of presenting dizziness, with the associated onset thereof corresponding to conclusion of most recent menstrual cycle, as further detailed above, will transfuse 1 unit PRBC, and closely monitor ensuing trend in hemoglobin as well as close monitoring for improvement in patient's presenting dizziness in  response to this measure.  She would likely also benefit from returning to see her outpatient gynecologist following discharge from the hospital.  No evidence of associated acute gastrointestinal bleed, and presenting BUN is noted to be nonelevated.  Not on any blood thinners.  Of note, while presentation is associated with acute thrombocytopenia, this is suspected to be consumptive in nature in the context of recent menorrhagia, with no clinical evidence to suggest hemolytic process at this time, including nonelevated presenting total bilirubin.    Plan: Transfuse 1 unit PBC over 2 hours, with repeat hemoglobin to be checked 30 minutes following completion of transfusion of this 1 unit.  I have also ordered every 4 hour H&H checks through 9 AM tomorrow (04/02/21).  Add on the following to pretransfusion labs: Ferritin, total iron, TIBC, MMA, folic acid level, reticulocyte count, and peripheral smear.  Monitor on telemetry.  Monitor continuous pulse oximetry.  Add on INR.  Repeat CBC in the morning.  Refraining from pharmacologic DVT prophylaxis.  SCDs.  Recommend the patient follow-up with her gynecologist as an outpatient following discharge from the hospital.  Repeat CMP in the morning.      #) Acute thrombocytopenia: Presenting CBC reflects platelet count of 77 relative to most recent prior value of 214  in August 2021.  Felt to be consumptive in the setting of recent menorrhagia, as further detailed above, without clinical evidence to suggest hemolytic process at this time.  DIC is felt to be less likely, but will check INR and PTT in order to further evaluate.  If these values are significantly low, could consider checking DIC panel at that point, including checking LDH, haptoglobin, and fibrin split products.  Not on any blood thinners as an outpatient, including no antiplatelet medications, including no aspirin.   Plan: Further evaluation management of acute on chronic blood loss anemia, as further detailed above, including plan for transfusion of 1 unit PRBC, and additional laboratory evaluation added onto pretransfusion specimen, including peripheral smear.  Repeat CBC in the morning.  Add on INR and PTT, as above.  SCDs.        #) Menorrhagia: Patient reports that she has been dealing with heavy flow associated with her menstrual cycle for several years now, and that per previous gynecologic evaluation, she was diagnosed with uterine fibroids that were felt to be the source of her menorrhagia.  She reports first day of last menstrual cycle was 2 and half weeks ago, followed by approximately 2 and half days of heavy flow followed by an additional 1 to 2 days of progressively lightening flow, without any interval vaginal bleeding over the last 2 weeks.  Plan: Further evaluation and management of presenting acute on chronic blood loss anemia as well as thrombocytopenia, as above.  Recommend the patient follow-up with her outpatient gynecologist for further evaluation management of menorrhagia, as further detailed above.      DVT prophylaxis: SCDs Code Status: Full code Family Communication: none Disposition Plan: Per Rounding Team Consults called: none  Admission status: Observation; med telemetry     Of note, this patient was added by me to the following Admit List/Treatment Team:  mcadmits.      PLEASE NOTE THAT DRAGON DICTATION SOFTWARE WAS USED IN THE CONSTRUCTION OF THIS NOTE.   [facility] Pager [phone] From [place]  Otherwise, please contact night-coverage  [email or web address] Password [email or web address]   04/01/2021, 11:00 PM

## 2021-04-01 NOTE — ED Triage Notes (Signed)
Pt presents to ED POV. Pt c/o generalized HA and dizziness. Pt reports Lamonte Sakai began 2d ago but dizziness started today. Pt hx of anemia

## 2021-04-01 NOTE — Progress Notes (Signed)
Brief note regarding preliminary plan, with full H&P to follow:  50 year old female with history of chronic anemia in the setting of measurer menorrhagia, who is admitted for further evaluation management of symptomatic acute on chronic anemia, presenting with complaint of 2 3 days of dizziness, and presenting labs notable for hemoglobin of 7.2.  Denies any recent melena or hematochezia.  Denies any vaginal bleeding over the last several days.  Not associate with any chest pain, shortness of breath, and appears hemodynamically stable at this time.  In the setting of symptomatic acute on chronic anemia, will transfuse 1 unit PRBC over 2 hours now, with plan for repeat hemoglobin following completion of this transfusion.  We will also add on the following labs to pretransfusion specimen: Iron studies, MMA, folic acid, reticulocyte count, peripheral smear.  Not on any blood thinners as an outpatient.    Babs Bertin, DO Hospitalist

## 2021-04-01 NOTE — ED Provider Notes (Signed)
[facility] EMERGENCY DEPARTMENT Provider Note   CSN: [id number] Arrival date & time: 04/01/21  0140     History Chief Complaint  Patient presents with   Migraine   Dizziness    Gabrielle Roberts is a 50 y.o. female with past medical history of IDA on iron supplements who presents to the emergency department today for migraine and dizziness.  Patient states that she started having a migraine yesterday while at work, states that she often gets migraines, her last one was last year.  Patient states that migraine was on the right side of her head, did not radiate anywhere.  Admits to gradual onset.  Fortunately patient's been waiting in the waiting room for 14 hours, they were able to give her some naproxen and patient states that her headache did resolve.  States that her dizziness also did resolve.  States this is typical for her for her migraines.  Patient denies any vision changes, photophobia, vision loss, eye pain, vomiting or diarrhea.  Denies any fevers or neck pain.  Patient does admit to some nausea when she had a headache.  Patient states that she has not had a headache since last year, does not take anything for these.  Patient does have anemia perpetrated by her menorrhagia and fibroids.  When evaluating patient and she was ambulating patient states that she did feel slightly dizzy.  Describes dizziness as lightheaded sensation, nonvertiginous.  Denies any shortness of breath, fatigue, chest pain, palpitations.  Denies any weakness.   HPI     Past Medical History:  Diagnosis Date   Anemia    Menorrhagia     Patient Active Problem List   Diagnosis Date Noted   Acute on chronic anemia 04/01/2021   Iron deficiency anemia due to chronic blood loss 05/11/2020   Lower GI bleed 04/01/2019   Symptomatic anemia 04/01/2019   Elevated blood pressure reading 04/01/2019   Thrombocytosis 04/01/2019    Past Surgical History:  Procedure Laterality Date   COLONOSCOPY  WITH PROPOFOL N/A 04/03/2019   Procedure: COLONOSCOPY WITH PROPOFOL;  Surgeon: Laurence Spates, MD;  Location: [facility];  Service: Gastroenterology;  Laterality: N/A;   ORIF ZYGOMATIC FRACTURE  01/27/2012   Procedure: OPEN REDUCTION INTERNAL FIXATION (ORIF) ZYGOMATIC FRACTURE;  Surgeon: Ascencion Dike, MD;  Location: [facility];  Service: ENT;  Laterality: Right;  repair left lip laceration   SALPINGECTOMY  2001   due to ectopic pregnancy.  Unsure of side.   TUBAL LIGATION  2003     OB History     Gravida  7   Para  6   Term  6   Preterm      AB  1   Living  6      SAB      IAB      Ectopic  1   Multiple      Live Births  68           Family History  Problem Relation Age of Onset   Diabetes Mother    Hypertension Mother    Diabetes Son     Social History   Tobacco Use   Smoking status: Never   Smokeless tobacco: Never  Vaping Use   Vaping Use: Never used  Substance Use Topics   Alcohol use: Yes    Comment: occasionally   Drug use: No    Home Medications Prior to Admission medications   Medication Sig Start Date End Date Taking?  Authorizing Provider  ferrous sulfate 325 (65 FE) MG tablet Take 1 tablet (325 mg total) by mouth 2 (two) times daily with a meal. 05/13/20  Yes Ladell Pier, MD  ibuprofen (ADVIL) 600 MG tablet Take 1 tablet (600 mg total) by mouth every 6 (six) hours as needed. Patient not taking: No sig reported 04/14/20   Rancour, Annie Main, MD  ondansetron (ZOFRAN ODT) 4 MG disintegrating tablet Take 1 tablet (4 mg total) by mouth every 8 (eight) hours as needed for nausea or vomiting. Patient not taking: No sig reported 04/14/20   Ezequiel Essex, MD    Allergies    Patient has no known allergies.  Review of Systems   Review of Systems  Constitutional:  Negative for chills, diaphoresis, fatigue and fever.  HENT:  Negative for congestion, sore throat and trouble swallowing.   Eyes:  Negative for pain and visual disturbance.   Respiratory:  Negative for cough, shortness of breath and wheezing.   Cardiovascular:  Negative for chest pain, palpitations and leg swelling.  Gastrointestinal:  Negative for abdominal distention, abdominal pain, diarrhea, nausea and vomiting.  Genitourinary:  Negative for difficulty urinating.  Musculoskeletal:  Negative for back pain, neck pain and neck stiffness.  Skin:  Negative for pallor.  Neurological:  Positive for dizziness and headaches. Negative for speech difficulty and weakness.  Psychiatric/Behavioral:  Negative for confusion.    Physical Exam Updated Vital Signs BP 124/78   Pulse 68   Temp 98.1 F (36.7 C) (Oral)   Resp 14   SpO2 100%   Physical Exam Constitutional:      General: She is not in acute distress.    Appearance: Normal appearance. She is not ill-appearing, toxic-appearing or diaphoretic.  HENT:     Mouth/Throat:     Mouth: Mucous membranes are moist.     Pharynx: Oropharynx is clear.  Eyes:     General: No scleral icterus.    Extraocular Movements: Extraocular movements intact.     Pupils: Pupils are equal, round, and reactive to light.  Cardiovascular:     Rate and Rhythm: Normal rate and regular rhythm.     Pulses: Normal pulses.     Heart sounds: Normal heart sounds.  Pulmonary:     Effort: Pulmonary effort is normal. No respiratory distress.     Breath sounds: Normal breath sounds. No stridor. No wheezing, rhonchi or rales.  Chest:     Chest wall: No tenderness.  Abdominal:     General: Abdomen is flat. There is no distension.     Palpations: Abdomen is soft.     Tenderness: There is no abdominal tenderness. There is no guarding or rebound.  Musculoskeletal:        General: No swelling or tenderness. Normal range of motion.     Cervical back: Normal range of motion and neck supple. No rigidity.     Right lower leg: No edema.     Left lower leg: No edema.  Skin:    General: Skin is warm and dry.     Capillary Refill: Capillary refill  takes less than 2 seconds.     Coloration: Skin is not pale.  Neurological:     General: No focal deficit present.     Mental Status: She is alert and oriented to person, place, and time.     Comments: Alert. Clear speech. No facial droop. CNIII-XII grossly intact. Bilateral upper and lower extremities' sensation grossly intact. 5/5 symmetric strength with grip strength and  with plantar and dorsi flexion bilaterally. Patellar DTRs are 2+ and symmetric . Normal finger to nose bilaterally. Negative pronator drift. Negative Romberg sign. Gait is steady and intact, however patient states that she does feel slightly dizzy upon standing.    Psychiatric:        Mood and Affect: Mood normal.        Behavior: Behavior normal.    ED Results / Procedures / Treatments   Labs (all labs ordered are listed, but only abnormal results are displayed) Labs Reviewed  CBC - Abnormal; Notable for the following components:      Result Value   Hemoglobin 7.6 (*)    Platelets 89 (*)    All other components within normal limits  CBC - Abnormal; Notable for the following components:   Hemoglobin 7.2 (*)    HCT 28.0 (*)    MCV 70.2 (*)    MCH 18.0 (*)    MCHC 25.7 (*)    RDW 29.2 (*)    Platelets 77 (*)    All other components within normal limits  I-STAT CHEM 8, ED - Abnormal; Notable for the following components:   BUN 5 (*)    Hemoglobin 9.5 (*)    HCT 28.0 (*)    All other components within normal limits  RESP PANEL BY RT-PCR (FLU A&B, COVID) ARPGX2  COMPREHENSIVE METABOLIC PANEL  HIV ANTIBODY (ROUTINE TESTING W REFLEX)  MAGNESIUM  MAGNESIUM  COMPREHENSIVE METABOLIC PANEL  CBC WITH DIFFERENTIAL/PLATELET  PROTIME-INR  PROTIME-INR  FERRITIN  FOLATE  METHYLMALONIC ACID, SERUM  PATHOLOGIST SMEAR REVIEW  RETICULOCYTES  IRON AND TIBC  HEMOGLOBIN AND HEMATOCRIT, BLOOD  HEMOGLOBIN AND HEMATOCRIT, BLOOD  TYPE AND SCREEN  PREPARE RBC (CROSSMATCH)    EKG EKG  Interpretation  Date/Time:  Thursday April 01 2021 16:25:00 EDT Ventricular Rate:  66 PR Interval:  191 QRS Duration: 84 QT Interval:  419 QTC Calculation: 439 R Axis:   13 Text Interpretation: Sinus rhythm Low voltage, precordial leads Consider anterior infarct No significant change since last tracing Confirmed by Theotis Burrow [phone]) on 04/01/2021 4:47:41 PM  Radiology No results found.  Procedures .Critical Care  Date/Time: 04/01/2021 10:37 PM Performed by: Alfredia Client, PA-C Authorized by: Alfredia Client, PA-C   Critical care provider statement:    Critical care time (minutes):  45   Critical care was time spent personally by me on the following activities:  Discussions with consultants, evaluation of patient's response to treatment, examination of patient, ordering and performing treatments and interventions, ordering and review of laboratory studies, ordering and review of radiographic studies, pulse oximetry, re-evaluation of patient's condition, obtaining history from patient or surrogate and review of old charts   Medications Ordered in ED Medications  0.9 %  sodium chloride infusion (Manually program via Guardrails IV Fluids) (has no administration in time range)  ferrous sulfate tablet 325 mg (has no administration in time range)  acetaminophen (TYLENOL) tablet 650 mg (has no administration in time range)    Or  acetaminophen (TYLENOL) suppository 650 mg (has no administration in time range)  naproxen (NAPROSYN) tablet 500 mg (500 mg Oral Given 04/01/21 0205)  sodium chloride 0.9 % bolus 1,000 mL (0 mLs Intravenous Stopped 04/01/21 1822)    ED Course  I have reviewed the triage vital signs and the nursing notes.  Pertinent labs & imaging results that were available during my care of the patient were reviewed by me and considered in my medical decision making (see chart for  details).    MDM Rules/Calculators/A&P                          Patient presents the emerge  department today for migraines and dizziness.  Patient has unfortunately been waiting in the waiting room for 14 hours, was given naproxen during MSE process and patient states that headache has resolved.  No red flag headache symptoms.  Normal neuro exam.  Patient is slightly dizzy upon standing, will obtain basic lab work at this time and fluids given.  Orthostatic vitals ordered.  Differential to include dizziness due to anemia.  Low likelihood for central causes of dizziness, no vertiginous symptoms.  Work-up today shows CBC of 7.6, unfortunately appears as if this is a lab error since all the other blood work from [facility] is pending.  Patient also additionally has new thrombocytopenia which patient has never had before.  Will obtain repeat, unfortunately patient has been waiting for multiple hours for this now, did discuss to patient that we should get repeat, patient wants to stay. Negative orthostatics.   Repeat CBC with hemoglobin of 7.2.  Patient is extremely symptomatic from her low hemoglobin, states that she is extremely dizzy every time she stands.  Repeat CBC also does show thrombocytopenia with platelets of 77.  Patient is not currently having any menorrhagia.  Patient to be admitted at this time for blood transfusion due to symptomatic anemia in addition to new thrombocytopenia work-up.  Patient consented for blood transfusion.  Patient admitted to Dr. Velia Meyer.  The patient appears reasonably stabilized for admission considering the current resources, flow, and capabilities available in the ED at this time, and I doubt any other [facility] requiring further screening and/or treatment in the ED prior to admission.  I discussed this case with my attending physician who cosigned this note including patient's presenting symptoms, physical exam, and planned diagnostics and interventions. Attending physician stated agreement with plan or made changes to plan which were implemented.   Attending physician  assessed patient at bedside.   Final Clinical Impression(s) / ED Diagnoses Final diagnoses:  Symptomatic anemia  Thrombocytopenia [facility])    Rx / DC Orders ED Discharge Orders     None        Alfredia Client, PA-C 04/01/21 2309    Little, Wenda Overland, MD 04/03/21 [phone]

## 2021-04-01 NOTE — ED Provider Notes (Signed)
Emergency Medicine Provider Triage Evaluation Note  Gabrielle Roberts , a 50 y.o. female  was evaluated in triage.  Pt complains of right temporal headache. Symptom onset yesterday. Mild improvement with Tylenol. Also has noted some preceding dizziness. Has been chewing on ice more often. Has hx of anemia for which she takes iron tablets. Anemia perpetuated by menorrhagia and fibroids. Has required a blood transfusion in the past and felt similar when this occurred.  Review of Systems  Positive: Headache  Negative: Syncope   Physical Exam  There were no vitals taken for this visit. Gen:   Awake, no distress   Resp:  Normal effort  MSK:   Moves extremities without difficulty  Other:  Ambulates with steady gait  Medical Decision Making  Medically screening exam initiated at 1:58 AM.  Appropriate orders placed.  ANGELIE KRAM was informed that the remainder of the evaluation will be completed by another provider, this initial triage assessment does not replace that evaluation, and the importance of remaining in the ED until their evaluation is complete.  Headache   Antonietta Breach, PA-C 04/01/21 0200    Ezequiel Essex, MD 04/01/21 [phone]

## 2021-04-01 NOTE — ED Notes (Signed)
Pt ambulated to and from the restroom.

## 2021-04-02 ENCOUNTER — Encounter (HOSPITAL_COMMUNITY): Payer: Self-pay | Admitting: Family Medicine

## 2021-04-02 DIAGNOSIS — D649 Anemia, unspecified: Secondary | ICD-10-CM | POA: Diagnosis not present

## 2021-04-02 LAB — IRON AND TIBC
Iron: 164 ug/dL (ref 28–170)
Saturation Ratios: 33 % — ABNORMAL HIGH (ref 10.4–31.8)
TIBC: 497 ug/dL — ABNORMAL HIGH (ref 250–450)
UIBC: 333 ug/dL

## 2021-04-02 LAB — URINALYSIS, COMPLETE (UACMP) WITH MICROSCOPIC
Bacteria, UA: NONE SEEN
Bilirubin Urine: NEGATIVE
Glucose, UA: NEGATIVE mg/dL
Hgb urine dipstick: NEGATIVE
Ketones, ur: NEGATIVE mg/dL
Leukocytes,Ua: NEGATIVE
Nitrite: NEGATIVE
Protein, ur: NEGATIVE mg/dL
Specific Gravity, Urine: 1.017 (ref 1.005–1.030)
pH: 7 (ref 5.0–8.0)

## 2021-04-02 LAB — RESP PANEL BY RT-PCR (FLU A&B, COVID) ARPGX2
Influenza A by PCR: NEGATIVE
Influenza B by PCR: NEGATIVE
SARS Coronavirus 2 by RT PCR: NEGATIVE

## 2021-04-02 LAB — BPAM RBC
Blood Product Expiration Date: 202208052359
ISSUE DATE / TIME: 202207072248
Unit Type and Rh: 5100

## 2021-04-02 LAB — FERRITIN: Ferritin: 7 ng/mL — ABNORMAL LOW (ref 11–307)

## 2021-04-02 LAB — CBC WITH DIFFERENTIAL/PLATELET
Abs Immature Granulocytes: 0.02 10*3/uL (ref 0.00–0.07)
Basophils Absolute: 0 10*3/uL (ref 0.0–0.1)
Basophils Relative: 1 %
Eosinophils Absolute: 0.1 10*3/uL (ref 0.0–0.5)
Eosinophils Relative: 1 %
HCT: 31.2 % — ABNORMAL LOW (ref 36.0–46.0)
Hemoglobin: 8.6 g/dL — ABNORMAL LOW (ref 12.0–15.0)
Immature Granulocytes: 0 %
Lymphocytes Relative: 28 %
Lymphs Abs: 1.7 10*3/uL (ref 0.7–4.0)
MCH: 20.1 pg — ABNORMAL LOW (ref 26.0–34.0)
MCHC: 27.6 g/dL — ABNORMAL LOW (ref 30.0–36.0)
MCV: 72.9 fL — ABNORMAL LOW (ref 80.0–100.0)
Monocytes Absolute: 0.4 10*3/uL (ref 0.1–1.0)
Monocytes Relative: 7 %
Neutro Abs: 3.7 10*3/uL (ref 1.7–7.7)
Neutrophils Relative %: 63 %
Platelets: 86 10*3/uL — ABNORMAL LOW (ref 150–400)
RBC: 4.28 MIL/uL (ref 3.87–5.11)
RDW: 29.2 % — ABNORMAL HIGH (ref 11.5–15.5)
WBC: 5.9 10*3/uL (ref 4.0–10.5)
nRBC: 0 % (ref 0.0–0.2)

## 2021-04-02 LAB — COMPREHENSIVE METABOLIC PANEL
ALT: 10 U/L (ref 0–44)
AST: 18 U/L (ref 15–41)
Albumin: 3.4 g/dL — ABNORMAL LOW (ref 3.5–5.0)
Alkaline Phosphatase: 42 U/L (ref 38–126)
Anion gap: 5 (ref 5–15)
BUN: 6 mg/dL (ref 6–20)
CO2: 27 mmol/L (ref 22–32)
Calcium: 8.9 mg/dL (ref 8.9–10.3)
Chloride: 105 mmol/L (ref 98–111)
Creatinine, Ser: 0.65 mg/dL (ref 0.44–1.00)
GFR, Estimated: >60 mL/min (ref >60)
Glucose, Bld: 89 mg/dL (ref 70–99)
Potassium: 3.7 mmol/L (ref 3.5–5.1)
Sodium: 137 mmol/L (ref 135–145)
Total Bilirubin: 0.7 mg/dL (ref 0.3–1.2)
Total Protein: 6.7 g/dL (ref 6.5–8.1)

## 2021-04-02 LAB — PROTIME-INR
INR: 1.1 (ref 0.8–1.2)
INR: 1 (ref 0.8–1.2)
Prothrombin Time: 13.7 seconds (ref 11.4–15.2)
Prothrombin Time: 13.6 seconds (ref 11.4–15.2)

## 2021-04-02 LAB — TYPE AND SCREEN
ABO/RH(D): O POS
Antibody Screen: NEGATIVE
Unit division: 0

## 2021-04-02 LAB — MAGNESIUM
Magnesium: 1.8 mg/dL (ref 1.7–2.4)
Magnesium: 1.8 mg/dL (ref 1.7–2.4)

## 2021-04-02 LAB — APTT
aPTT: 29 seconds (ref 24–36)
aPTT: 36 seconds (ref 24–36)

## 2021-04-02 LAB — RETICULOCYTES
Immature Retic Fract: 17.6 % — ABNORMAL HIGH (ref 2.3–15.9)
RBC.: 4.15 MIL/uL (ref 3.87–5.11)
Retic Count, Absolute: 28 10*3/uL (ref 19.0–186.0)
Retic Ct Pct: 0.7 % (ref 0.4–3.1)

## 2021-04-02 LAB — LACTATE DEHYDROGENASE: LDH: 129 U/L (ref 98–192)

## 2021-04-02 LAB — HEMOGLOBIN AND HEMATOCRIT, BLOOD
HCT: 31.1 % — ABNORMAL LOW (ref 36.0–46.0)
Hemoglobin: 8.5 g/dL — ABNORMAL LOW (ref 12.0–15.0)

## 2021-04-02 LAB — FOLATE: Folate: 16.5 ng/mL (ref >5.9)

## 2021-04-02 LAB — HIV ANTIBODY (ROUTINE TESTING W REFLEX): HIV Screen 4th Generation wRfx: NONREACTIVE

## 2021-04-02 MED ORDER — VITAMIN B-12 1000 MCG PO TABS
1000.0000 ug | ORAL_TABLET | Freq: Every day | ORAL | Status: DC
  Administered 2021-04-02 – 2021-04-03 (×2): 1000 ug via ORAL
  Filled 2021-04-02 (×2): qty 1

## 2021-04-02 MED ORDER — SODIUM CHLORIDE 0.9 % IV SOLN
300.0000 mg | INTRAVENOUS | Status: AC
  Administered 2021-04-02 – 2021-04-03 (×2): 300 mg via INTRAVENOUS
  Filled 2021-04-02 (×2): qty 15

## 2021-04-02 NOTE — Hospital Course (Signed)
50 year old black female BMI >40 with class II obesity Prior lower GI bleed 04/15/2019 colonoscopy nonrevealing at that time--only had grade 1 internal hemorrhoids Menorrhagia followed by [facility]--previous transfusion 4 units PRBC 04/01/2019 and found to have 13 X8X 8 cm fibroids--at [facility] with Dr. Sabra Heck 04/08/2019 multiple options were discussed with the patient regarding management  04/01/2021 40 H/oh dizziness presyncope Lab work-up = hemoglobin 7.2 platelet 77 down from 214  Rx liter saline bolus transfuse 1 unit PRBC

## 2021-04-02 NOTE — ED Notes (Signed)
Pt given urine cup to provide sample when able

## 2021-04-02 NOTE — Progress Notes (Signed)
PROGRESS NOTE   Gabrielle Roberts  CHE:[id number] DOB: 11/11/1970 DOA: 04/01/2021 PCP: Pcp, No  Brief Narrative:  50 year old morbidly obese black female G7 P6-0-0-6-menarche 17-menometrorrhagia since that time followed previously by Dr. Sabra Heck 2020 lost to follow-up-Rx at the time contemplative hormonal versus hysterectomy as completed family Prior GI bleed 04/15/2019 internal hemorrhoids Lost to follow-up no PCP  Present 04/01/2021 [facility], ED-syncope dizzy weak X several weeks Hemoglobin found to be 7.2 baseline in the 8 range-also found to have new thrombocytopenia plt in the 70s (prior admissions found to have thrombocytosis in the thousand range)  Hospital-Problem based course  Acute blood loss anemia superimposed on chronic menorrhagia Cycles currently last 5 days heavy bleeding + clots first several days-consideration for Provera versus megestrol in the outpatient setting as she is now bleeding now-defer to Dr. Sabra Heck Hemoglobin acceptable 7-8 range after 1 unit and she feels symptomatically better New thrombocytopenia DIC unlikely-no precipitant-not on any meds other than iron-await LDH, haptoglobin that I have ordered this morning in addition we will follow other labs If thrombocytopenia does not resolve we will ask nonemergent input from hematology platelet smear is still pending Follow HIV Obesity BMI >40 class II Outpatient strategies as per PCP/OB/GYN    DVT prophylaxis: SCD Code Status: Presumed full Family Communication: None present Disposition:  Status is: Observation  The patient will require care spanning > 2 midnights and should be moved to inpatient because: Hemodynamically unstable and Persistent severe electrolyte disturbances  Dispo: The patient is from: Home              Anticipated d/c is to: Home              Patient currently is not medically stable to d/c.   Difficult to place patient No       Consultants:  Hematology  Procedures:  None  Antimicrobials: None   Subjective: Pleasant coherent oriented no distress No bleeding currently no chest pain no fever no chills no nausea no vomiting no elicits  Objective: Vitals:   04/02/21 0415 04/02/21 0600 04/02/21 0730 04/02/21 0830  BP: 134/87 133/78 (!) 143/87 (!) 131/98  Pulse: (!) 57 (!) 59 71 71  Resp: 12 16 20  (!) 24  Temp:   98.1 F (36.7 C)   TempSrc:   Oral   SpO2: 99% 98% 99% 100%    Intake/Output Summary (Last 24 hours) at 04/02/2021 1035 Last data filed at 04/02/2021 0142 Gross per 24 hour  Intake 1075 ml  Output --  Net 1075 ml   There were no vitals filed for this visit.  Examination:  EOMI NCAT thick neck Mallampati 4 tattoo on left neck S1-S2 no murmur no rub no gallop Abdomen obese nontender no rebound no guarding No lower extremity edema ROM intact Moving all 4 limbs equally Smile symmetric Genitourinary deferred  Data Reviewed: personally reviewed   CBC    Component Value Date/Time   WBC 5.9 04/02/2021 0404   RBC 4.28 04/02/2021 0404   HGB 8.5 (L) 04/02/2021 0900   HGB 7.7 (L) 05/11/2020 1553   HCT 31.1 (L) 04/02/2021 0900   HCT 28.9 (L) 05/11/2020 1553   PLT 86 (L) 04/02/2021 0404   PLT 214 05/11/2020 1553   MCV 72.9 (L) 04/02/2021 0404   MCV 70 (L) 05/11/2020 1553   MCH 20.1 (L) 04/02/2021 0404   MCHC 27.6 (L) 04/02/2021 0404   RDW 29.2 (H) 04/02/2021 0404   RDW 18.8 (H) 05/11/2020 1553   LYMPHSABS 1.7  04/02/2021 0404   MONOABS 0.4 04/02/2021 0404   EOSABS 0.1 04/02/2021 0404   BASOSABS 0.0 04/02/2021 0404   CMP Latest Ref Rng & Units 04/02/2021 04/01/2021 04/01/2021  Glucose 70 - 99 mg/dL 89 92 96  BUN 6 - 20 mg/dL 6 8 5(L)  Creatinine 0.44 - 1.00 mg/dL 0.65 0.63 0.70  Sodium 135 - 145 mmol/L 137 137 139  Potassium 3.5 - 5.1 mmol/L 3.7 4.0 3.9  Chloride 98 - 111 mmol/L 105 104 104  CO2 22 - 32 mmol/L 27 26 -  Calcium 8.9 - 10.3 mg/dL 8.9 9.0 -  Total Protein 6.5 - 8.1 g/dL 6.7 7.2 -  Total Bilirubin 0.3 - 1.2 mg/dL  0.7 0.4 -  Alkaline Phos 38 - 126 U/L 42 43 -  AST 15 - 41 U/L 18 20 -  ALT 0 - 44 U/L 10 11 -     Radiology Studies: No results found.   Scheduled Meds:  ferrous sulfate  325 mg Oral BID WC   Continuous Infusions:   LOS: 0 days   Time spent: 40  Nita Sells, MD [facility] To contact the attending provider between 7A-7P or the covering provider during after hours 7P-7A, please log into the web site [email or web address] and access using universal [place] password for that web site. If you do not have the password, please call the hospital operator.  04/02/2021, 10:35 AM

## 2021-04-02 NOTE — Consult Note (Addendum)
[place]  [phone] [phone]    [place]  Referring MD:  Dr. Nita Sells  Reason for Referral: Anemia and thrombocytopenia  HPI: Ms. Ruis is a 50 year old female with a past medical history significant for chronic iron deficiency anemia, menorrhagia, uterine fibroids.  She presented to the emergency department with dizziness.  Dizziness present x3 to 4 days.  She has not been having any chest pain, shortness of breath, diaphoresis, or palpitations.  She has not seen any obvious bleeding.  The patient reported having heavy periods with the first day of her last cycle being about 2-1/2 weeks ago and was associated with her typical 2 to 2-1/2 days of heavy flow with dissipating flow over the next 1 to 2 days.  She has been seen by her gynecologist as an outpatient for fibroids.  And plan was to initiate oral contraceptives and possible hysterectomy at some point in the future for management of her menorrhagia secondary to acute on chronic anemia.  She has not been able to follow-up with her gynecologist due to concern for finances.  She has not been using any blood thinners or aspirin as an outpatient.  She has been taking oral iron once a day.  She also reported a history of a lower GI bleed in July 2020 and underwent a colonoscopy at that time.  She was found to have nonbleeding hemorrhoids on the colonoscopy.  On admission, her hemoglobin was 7.6 and platelets were 89,000.  Her MCV was low at 70.2.  Her renal and liver function were normal.  She received 1 unit PRBCs on 04/01/2021.  Additional lab work showed a ferritin level of 7, iron 164, TIBC 497, percent saturation 33%.  Folate was normal.  LDH normal at 129 and immature reticulocyte fraction was mildly elevated at 17.6%.  Methylmalonic acid and haptoglobin are pending.  Of note, she has significant thrombocytosis in the past  I saw the patient in the emergency department.   Her oldest daughter was at the bedside.  She is feeling much better today after receiving 1 unit of PRBCs.  She has been up and ambulating without any recurrent dizziness.  She reported that she was having headaches prior to admission which have now resolved.  She denies chest pain, shortness of breath, cough, abdominal pain, nausea, vomiting.  She reports that with her last period she was having up to 3 days of very heavy bleeding and was having to change her tampon or pad every few hours.  She was also passing large clots.  She has not noticed any other bleeding such as epistaxis, hemoptysis, hematemesis, melena, hematochezia, hematuria.  She tells me that she has received blood transfusions in the past and received IV iron during the hospital admission in 2020. Hematology was asked to see the patient to make recommendations regarding her anemia and mild thrombocytopenia.  The patient has ice pica.  She is not regular diet.  She has 6 pregnancies.  She told me she was anemic during pregnancy as well  Past Medical History:  Diagnosis Date   Anemia    Menorrhagia   :  Past Surgical History:  Procedure Laterality Date   COLONOSCOPY WITH PROPOFOL N/A 04/03/2019   Procedure: COLONOSCOPY WITH PROPOFOL;  Surgeon: Laurence Spates, MD;  Location: [facility];  Service: Gastroenterology;  Laterality: N/A;   ORIF ZYGOMATIC FRACTURE  01/27/2012   Procedure: OPEN REDUCTION INTERNAL FIXATION (ORIF) ZYGOMATIC FRACTURE;  Surgeon: Ascencion Dike, MD;  Location: [facility];  Service: ENT;  Laterality: Right;  repair left lip laceration   SALPINGECTOMY  2001   due to ectopic pregnancy.  Unsure of side.   TUBAL LIGATION  2003  :   CURRENT MEDS: Current Facility-Administered Medications  Medication Dose Route Frequency Provider Last Rate Last Admin   acetaminophen (TYLENOL) tablet 650 mg  650 mg Oral Q6H PRN Howerter, Justin B, DO       Or   acetaminophen (TYLENOL) suppository 650 mg  650 mg Rectal Q6H PRN Howerter,  Justin B, DO       ferrous sulfate tablet 325 mg  325 mg Oral BID WC Howerter, Justin B, DO   325 mg at 04/02/21 0805   Current Outpatient Medications  Medication Sig Dispense Refill   ferrous sulfate 325 (65 FE) MG tablet Take 1 tablet (325 mg total) by mouth 2 (two) times daily with a meal. 120 tablet 3   ibuprofen (ADVIL) 600 MG tablet Take 1 tablet (600 mg total) by mouth every 6 (six) hours as needed. (Patient not taking: No sig reported) 30 tablet 0   ondansetron (ZOFRAN ODT) 4 MG disintegrating tablet Take 1 tablet (4 mg total) by mouth every 8 (eight) hours as needed for nausea or vomiting. (Patient not taking: No sig reported) 20 tablet 0      No Known Allergies:   Family History  Problem Relation Age of Onset   Diabetes Mother    Hypertension Mother    Diabetes Son   :   Social History   Socioeconomic History   Marital status: Single    Spouse name: Not on file   Number of children: 6   Years of education: 12 grade   Highest education level: Not on file  Occupational History   Occupation: Glass blower/designer  Tobacco Use   Smoking status: Never   Smokeless tobacco: Never  Vaping Use   Vaping Use: Never used  Substance and Sexual Activity   Alcohol use: Yes    Comment: occasionally   Drug use: No   Sexual activity: Not Currently    Birth control/protection: Condom  Other Topics Concern   Not on file  Social History Narrative   Not on file   Social Determinants of Health   Financial Resource Strain: Not on file  Food Insecurity: Not on file  Transportation Needs: Not on file  Physical Activity: Not on file  Stress: Not on file  Social Connections: Not on file  Intimate Partner Violence: Not on file  :  REVIEW OF SYSTEMS:  A comprehensive 14 point review of systems was negative except as noted in the HPI.    Exam: Patient Vitals for the past 24 hrs:  BP Temp Temp src Pulse Resp SpO2  04/02/21 1100 133/84 -- -- 75 (!) 24 100 %  04/02/21 0830 (!)  131/98 -- -- 71 (!) 24 100 %  04/02/21 0730 (!) 143/87 98.1 F (36.7 C) Oral 71 20 99 %  04/02/21 0600 133/78 -- -- (!) 59 16 98 %  04/02/21 0415 134/87 -- -- (!) 57 12 99 %  04/02/21 0142 128/76 98.1 F (36.7 C) Oral (!) 56 15 100 %  04/01/21 2330 138/73 -- -- 67 (!) 23 100 %  04/01/21 2315 121/78 98.1 F (36.7 C) Oral 64 18 100 %  04/01/21 2256 124/78 98.1 F (36.7 C) Oral 68 14 100 %  04/01/21 2153 (!) 146/89 -- -- 70 20 99 %  04/01/21 1915  110/82 -- -- (!) 53 15 100 %  04/01/21 1824 137/71 -- -- 71 (!) 22 96 %  04/01/21 1730 -- -- -- 89 15 100 %  04/01/21 1700 139/76 -- -- -- -- --  04/01/21 1630 130/83 -- -- 66 17 100 %  04/01/21 1548 (!) 153/89 -- -- 61 15 100 %  04/01/21 1306 114/73 -- -- (!) 58 16 100 %    General:  well-nourished in no acute distress.   Eyes:  no scleral icterus.   ENT:  There were no oropharyngeal lesions.   Lymphatics:  Negative cervical, supraclavicular or axillary adenopathy.   Respiratory: lungs were clear bilaterally without wheezing or crackles.   Cardiovascular:  Regular rate and rhythm, S1/S2, without murmur, rub or gallop.  There was no pedal edema.   GI:  abdomen was soft, flat, nontender, nondistended, without organomegaly.   Musculoskeletal:  no spinal tenderness of palpation of vertebral spine.   Skin exam was without ecchymosis, petechiae.   Neuro exam was nonfocal.  Patient was alert and oriented.  Attention was good.   Language was appropriate.  Mood was normal without depression.  Speech was not pressured.  Thought content was not tangential.    LABS:  Lab Results  Component Value Date   WBC 5.9 04/02/2021   HGB 8.5 (L) 04/02/2021   HCT 31.1 (L) 04/02/2021   PLT 86 (L) 04/02/2021   GLUCOSE 89 04/02/2021   ALT 10 04/02/2021   AST 18 04/02/2021   NA 137 04/02/2021   K 3.7 04/02/2021   CL 105 04/02/2021   CREATININE 0.65 04/02/2021   BUN 6 04/02/2021   CO2 27 04/02/2021   INR 1.1 04/02/2021    No results  found.   ASSESSMENT AND PLAN:   Iron deficiency anemia The patient has a longstanding history of iron deficiency anemia secondary to menorrhagia and uterine fibroids Has received PRBC transfusions and IV iron in the past Reports taking oral iron once a day (sometimes misses doses) Status post 1 unit PRBCs on 04/01/2021 with improvement of her symptoms Recommend proceeding with IV iron sucrose today and tomorrow The most likely cause of her anemia is due to chronic blood loss/malabsorption syndrome. We discussed some of the risks, benefits, and alternatives of intravenous iron infusions. The patient is symptomatic from anemia and the iron level is critically low. She tolerated oral iron supplement poorly and desires to achieved higher levels of iron faster for adequate hematopoesis. Some of the side-effects to be expected including risks of infusion reactions, phlebitis, headaches, nausea and fatigue.  The patient is willing to proceed. Patient education material was dispensed.  Goal is to keep ferritin level greater than 50 and resolution of anemia She can be discharged tomorrow after second dose of IV iron if clinically stable  Thrombocytopenia Thrombocytopenia is a new finding this admission Unclear etiology but may be due to consumption from heavy periods Monitor for now I will start her on oral vitamin B12 supplement would also assist in effective erythropoiesis  Uterine fibroids and menorrhagia Recommend follow-up with gynecology for management of uterine fibroids and menorrhagia  Discharge planning Possibly tomorrow if stable If she is discharged tomorrow, I will set up outpatient follow-up appointment  Thank you for this referral.  Mikey Bussing, DNP, AGPCNP-BC, AOCNP [name], MD

## 2021-04-03 ENCOUNTER — Other Ambulatory Visit: Payer: Self-pay | Admitting: Hematology and Oncology

## 2021-04-03 DIAGNOSIS — D696 Thrombocytopenia, unspecified: Secondary | ICD-10-CM

## 2021-04-03 DIAGNOSIS — D500 Iron deficiency anemia secondary to blood loss (chronic): Secondary | ICD-10-CM

## 2021-04-03 DIAGNOSIS — D649 Anemia, unspecified: Secondary | ICD-10-CM | POA: Diagnosis not present

## 2021-04-03 LAB — CBC WITH DIFFERENTIAL/PLATELET
Abs Immature Granulocytes: 0.04 10*3/uL (ref 0.00–0.07)
Basophils Absolute: 0 10*3/uL (ref 0.0–0.1)
Basophils Relative: 1 %
Eosinophils Absolute: 0.1 10*3/uL (ref 0.0–0.5)
Eosinophils Relative: 1 %
HCT: 28.5 % — ABNORMAL LOW (ref 36.0–46.0)
Hemoglobin: 8 g/dL — ABNORMAL LOW (ref 12.0–15.0)
Immature Granulocytes: 1 %
Lymphocytes Relative: 19 %
Lymphs Abs: 1.5 10*3/uL (ref 0.7–4.0)
MCH: 20 pg — ABNORMAL LOW (ref 26.0–34.0)
MCHC: 28.1 g/dL — ABNORMAL LOW (ref 30.0–36.0)
MCV: 71.1 fL — ABNORMAL LOW (ref 80.0–100.0)
Monocytes Absolute: 0.7 10*3/uL (ref 0.1–1.0)
Monocytes Relative: 8 %
Neutro Abs: 5.5 10*3/uL (ref 1.7–7.7)
Neutrophils Relative %: 70 %
Platelets: 74 10*3/uL — ABNORMAL LOW (ref 150–400)
RBC: 4.01 MIL/uL (ref 3.87–5.11)
RDW: 29 % — ABNORMAL HIGH (ref 11.5–15.5)
WBC: 7.9 10*3/uL (ref 4.0–10.5)
nRBC: 0 % (ref 0.0–0.2)

## 2021-04-03 LAB — HAPTOGLOBIN: Haptoglobin: 114 mg/dL (ref 42–296)

## 2021-04-03 MED ORDER — CYANOCOBALAMIN 1000 MCG PO TABS: 1000.0000 ug | ORAL_TABLET | Freq: Every day | ORAL | 3 refills | Status: DC

## 2021-04-03 NOTE — Progress Notes (Signed)
0958 hrs: Venofer given early at this time per VO Dr. Verlon Au. Appropriateness of frequency verified with Meribeth Mattes, [facility] prior to administration. Care team is in agreement we are safe to proceed with infusion. Plan is for patient to receive the Venofer then discharge to home.

## 2021-04-03 NOTE — Plan of Care (Signed)
Patient was admitted with anemia. Per the patient, all of her symptoms have now resolved. Anticipate discharge today.   Problem: Health Behavior/Discharge Planning: Goal: Ability to manage health-related needs will improve Outcome: Progressing   Problem: Clinical Measurements: Goal: Ability to maintain clinical measurements within normal limits will improve Outcome: Progressing Goal: Diagnostic test results will improve Outcome: Progressing   Problem: Activity: Goal: Risk for activity intolerance will decrease Outcome: Progressing   Problem: Safety: Goal: Ability to remain free from injury will improve Outcome: Progressing

## 2021-04-03 NOTE — Plan of Care (Signed)
Patient discharged from facility and having received her dose of Venofer. Patient endorses no symptoms at all, states "I feel great". Patient denies questions or concerns at this time.   Problem: Health Behavior/Discharge Planning: Goal: Ability to manage health-related needs will improve 04/03/2021 1217 by Jesse Sans, RN Outcome: Adequate for Discharge 04/03/2021 0740 by Jesse Sans, RN Outcome: Progressing   Problem: Clinical Measurements: Goal: Ability to maintain clinical measurements within normal limits will improve 04/03/2021 1217 by Jesse Sans, RN Outcome: Adequate for Discharge 04/03/2021 0740 by Jesse Sans, RN Outcome: Progressing Goal: Diagnostic test results will improve 04/03/2021 1217 by Jesse Sans, RN Outcome: Adequate for Discharge 04/03/2021 0740 by Jesse Sans, RN Outcome: Progressing   Problem: Activity: Goal: Risk for activity intolerance will decrease 04/03/2021 1217 by Jesse Sans, RN Outcome: Adequate for Discharge 04/03/2021 0740 by Jesse Sans, RN Outcome: Progressing   Problem: Safety: Goal: Ability to remain free from injury will improve 04/03/2021 1217 by Jesse Sans, RN Outcome: Adequate for Discharge 04/03/2021 0740 by Jesse Sans, RN Outcome: Progressing

## 2021-04-03 NOTE — Discharge Summary (Signed)
Physician Discharge Summary  Gabrielle Roberts NWG:[id number] DOB: [birth year] DOA: 04/01/2021  PCP: Pcp, No  Admit date: 04/01/2021 Discharge date: 04/03/2021  Time spent: 26 minutes  Recommendations for Outpatient Follow-up:  Require CBC iron panel and B12 in the outpatient setting asked for Dr. Simeon Craft such will be CCed on this note Patient coordinate with Dr. Sabra Heck her OB/GYN for outpatient follow-up for menorrhagia   Discharge Diagnoses:  MAIN problem for hospitalization   Iron deficiency anemia with thrombocytopenia and likely reactive to this  Please see below for itemized issues addressed in [place]- refer to other progress notes for clarity if needed  Discharge Condition: Improved  Diet recommendation: Heart healthy  Filed Weights   04/02/21 2100  Weight: 100.2 kg    History of present illness:  50 year old morbidly obese black female G7 P6-0-0-6-menarche 17-menometrorrhagia since that time followed previously by Dr. Sabra Heck 2020 lost to follow-up-Rx at the time contemplative hormonal versus hysterectomy as completed family Prior GI bleed 04/15/2019 internal hemorrhoids Lost to follow-up with OB/GYN and does not have PCP  Present 04/01/2021 [facility], ED-syncope dizzy weak X several weeks Hemoglobin found to be 7.2 baseline in the 8 range-also found to have new thrombocytopenia plt in the 70s (prior admissions found to have thrombocytosis in the thousand range)  Hematologist consulted and gave opinion  Hospital Course:  Acute blood loss anemia superimposed on chronic menorrhagia Cycles currently last 5 days heavy bleeding + clots first several days-consideration for Provera versus megestrol in the outpatient setting as she is now bleeding now-defer to Dr. 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 Simeon Craft such  Discharge Exam: Vitals:   04/03/21 0700 04/03/21 0802  BP:  140/88  Pulse: 66   Resp: 17 20  Temp:  98.6 F (37 C)  SpO2: 97%     Subj on day of d/c   Awake sitting up no distress no dizziness and ambulatory eating and drinking - Fever - chills - nausea  General Exam on discharge  ROM intact EOMI NCAT no focal deficit no icterus no pallor Chest clear no added sound no rales no rhonchi Abdomen soft no rebound no guarding quite obese cannot appreciate organomegaly No lower extremity edema no rash Neurologically intact no focal deficit  Discharge Instructions   Discharge Instructions     Diet - low sodium heart healthy   Complete by: As directed    Discharge instructions   Complete by: As directed    Take iron x 2 per day--we have started u as well on vitamin b12 Follow with OBGYN DR. Amalia Greenhouse appt Follow with Hematologist Dr. Rosanne Sack meds are B12 Stop NSAIDS/Alcohol  Get labs ~ 2 weesk or as per Dr./ Alvy Bimler   Increase activity slowly   Complete by: As directed       Allergies as of 04/03/2021   No Known Allergies      Medication List     STOP taking these medications    ibuprofen 600 MG tablet Commonly known as: ADVIL  TAKE these medications    cyanocobalamin 1000 MCG tablet Take 1 tablet (1,000 mcg total) by mouth daily.   ferrous sulfate 325 (65 FE) MG tablet Take 1 tablet (325 mg total) by mouth 2 (two) times daily with a meal.   ondansetron 4 MG disintegrating tablet Commonly known as: Zofran ODT Take 1 tablet (4 mg total) by mouth every 8 (eight) hours as  needed for nausea or vomiting.       No Known Allergies    The results of significant diagnostics from this hospitalization (including imaging, microbiology, ancillary and laboratory) are listed below for reference.    Significant Diagnostic Studies: No results found.  Microbiology: Recent Results (from the past 240 hour(s))  Resp Panel by RT-PCR (Flu A&B, Covid) Nasopharyngeal Swab     Status: None   Collection Time: 04/01/21 11:05 PM   Specimen: Nasopharyngeal Swab; Nasopharyngeal(NP) swabs in vial transport medium  Result Value Ref Range Status   SARS Coronavirus 2 by RT PCR NEGATIVE NEGATIVE Final    Comment: (NOTE) SARS-CoV-2 target nucleic acids are NOT DETECTED.  The SARS-CoV-2 RNA is generally detectable in upper respiratory specimens during the acute phase of infection. The lowest concentration of SARS-CoV-2 viral copies this assay can detect is 138 copies/mL. A negative result does not preclude SARS-Cov-2 infection and should not be used as the sole basis for treatment or other patient management decisions. A negative result may occur with  improper specimen collection/handling, submission of specimen other than nasopharyngeal swab, presence of viral mutation(s) within the areas targeted by this assay, and inadequate number of viral copies(<138 copies/mL). A negative result must be combined with clinical observations, patient history, and epidemiological information. The expected result is Negative.  Fact Sheet for Patients:  EntrepreneurPulse.com.au  Fact Sheet for Healthcare Providers:  IncredibleEmployment.be  This test is no t yet approved or cleared by the [place] FDA and  has been authorized for detection and/or diagnosis of SARS-CoV-2 by FDA under an Emergency Use Authorization (EUA). This EUA will remain  in effect (meaning this test can be used) for the duration of the COVID-19 declaration under Section  564(b)(1) of the Act, 21 U.S.C.section 360bbb-3(b)(1), unless the authorization is terminated  or revoked sooner.       Influenza A by PCR NEGATIVE NEGATIVE Final   Influenza B by PCR NEGATIVE NEGATIVE Final    Comment: (NOTE) The Xpert Xpress SARS-CoV-2/FLU/RSV plus assay is intended as an aid in the diagnosis of influenza from Nasopharyngeal swab specimens and should not be used as a sole basis for treatment. Nasal washings and aspirates are unacceptable for Xpert Xpress SARS-CoV-2/FLU/RSV testing.  Fact Sheet for Patients: EntrepreneurPulse.com.au  Fact Sheet for Healthcare Providers: IncredibleEmployment.be  This test is not yet approved or cleared by the [place] FDA and has been authorized for detection and/or diagnosis of SARS-CoV-2 by FDA under an Emergency Use Authorization (EUA). This EUA will remain in effect (meaning this test can be used) for the duration of the COVID-19 declaration under Section 564(b)(1) of the Act, 21 U.S.C. section 360bbb-3(b)(1), unless the authorization is terminated or revoked.  Performed at [facility], [place] [address], [place], [place] [address]      Labs: Basic Metabolic Panel: Recent Labs  Lab 04/01/21 0207 04/01/21 1618 04/02/21 0404 04/02/21 1138  NA 139 137 137  --   K 3.9 4.0 3.7  --   CL 104 104 105  --   CO2  --  26 27  --  GLUCOSE 96 92 89  --   BUN 5* 8 6  --   CREATININE 0.70 0.63 0.65  --   CALCIUM  --  9.0 8.9  --   MG  --   --  1.8 1.8   Liver Function Tests: Recent Labs  Lab 04/01/21 1618 04/02/21 0404  AST 20 18  ALT 11 10  ALKPHOS 43 42  BILITOT 0.4 0.7  PROT 7.2 6.7  ALBUMIN 3.7 3.4*   No results for input(s): LIPASE, AMYLASE in the last 168 hours. No results for input(s): AMMONIA in the last 168 hours. CBC: Recent Labs  Lab 04/01/21 1618 04/01/21 2124 04/02/21 0404 04/02/21 0900 04/03/21 0147  WBC 6.2 6.0 5.9  --  7.9  NEUTROABS  --   --   3.7  --  5.5  HGB 7.6* 7.2* 8.6* 8.5* 8.0*  HCT RESULTS UNAVAILABLE DUE TO INTERFERING SUBSTANCE 28.0* 31.2* 31.1* 28.5*  MCV RESULTS UNAVAILABLE DUE TO INTERFERING SUBSTANCE 70.2* 72.9*  --  71.1*  PLT 89* 77* 86*  --  74*   Cardiac Enzymes: No results for input(s): CKTOTAL, CKMB, CKMBINDEX, TROPONINI in the last 168 hours. BNP: BNP (last 3 results) No results for input(s): BNP in the last 8760 hours.  ProBNP (last 3 results) No results for input(s): PROBNP in the last 8760 hours.  CBG: No results for input(s): GLUCAP in the last 168 hours.     Signed:  Nita Sells MD   [facility] 04/03/2021, 9:03 AM

## 2021-04-05 ENCOUNTER — Encounter (HOSPITAL_BASED_OUTPATIENT_CLINIC_OR_DEPARTMENT_OTHER): Payer: Self-pay | Admitting: Obstetrics & Gynecology

## 2021-04-06 LAB — PATHOLOGIST SMEAR REVIEW

## 2021-04-06 LAB — METHYLMALONIC ACID, SERUM: Methylmalonic Acid, Quantitative: 149 nmol/L (ref 0–378)

## 2021-04-07 ENCOUNTER — Telehealth: Payer: Self-pay | Admitting: Hematology and Oncology

## 2021-04-07 NOTE — Telephone Encounter (Signed)
Received a sch msg fofr Gabrielle Roberts to see Dr. Alvy Bimler. Pt has been cld and scheduled to see Dr. Alvy Bimler on 7/15 at 1:45pm w/labs at 1:15pm. Pt aware to arrive 30 minutes early.

## 2021-04-09 ENCOUNTER — Other Ambulatory Visit: Payer: Self-pay

## 2021-04-09 ENCOUNTER — Inpatient Hospital Stay: Payer: BC Managed Care – PPO

## 2021-04-09 ENCOUNTER — Inpatient Hospital Stay: Payer: BC Managed Care – PPO | Attending: Hematology and Oncology | Admitting: Hematology and Oncology

## 2021-04-09 ENCOUNTER — Encounter: Payer: Self-pay | Admitting: Hematology and Oncology

## 2021-04-09 DIAGNOSIS — D500 Iron deficiency anemia secondary to blood loss (chronic): Secondary | ICD-10-CM | POA: Insufficient documentation

## 2021-04-09 DIAGNOSIS — N920 Excessive and frequent menstruation with regular cycle: Secondary | ICD-10-CM | POA: Insufficient documentation

## 2021-04-09 DIAGNOSIS — D696 Thrombocytopenia, unspecified: Secondary | ICD-10-CM

## 2021-04-09 DIAGNOSIS — N922 Excessive menstruation at puberty: Secondary | ICD-10-CM

## 2021-04-09 LAB — SAMPLE TO BLOOD BANK

## 2021-04-09 LAB — CBC WITH DIFFERENTIAL/PLATELET
Abs Immature Granulocytes: 0.06 10*3/uL (ref 0.00–0.07)
Basophils Absolute: 0.1 10*3/uL (ref 0.0–0.1)
Basophils Relative: 1 %
Eosinophils Absolute: 0.1 10*3/uL (ref 0.0–0.5)
Eosinophils Relative: 1 %
HCT: 34.6 % — ABNORMAL LOW (ref 36.0–46.0)
Hemoglobin: 9.7 g/dL — ABNORMAL LOW (ref 12.0–15.0)
Immature Granulocytes: 1 %
Lymphocytes Relative: 25 %
Lymphs Abs: 2.2 10*3/uL (ref 0.7–4.0)
MCH: 21.3 pg — ABNORMAL LOW (ref 26.0–34.0)
MCHC: 28 g/dL — ABNORMAL LOW (ref 30.0–36.0)
MCV: 76 fL — ABNORMAL LOW (ref 80.0–100.0)
Monocytes Absolute: 0.7 10*3/uL (ref 0.1–1.0)
Monocytes Relative: 8 %
Neutro Abs: 5.7 10*3/uL (ref 1.7–7.7)
Neutrophils Relative %: 64 %
Platelets: 213 10*3/uL (ref 150–400)
RBC: 4.55 MIL/uL (ref 3.87–5.11)
RDW: 33.5 % — ABNORMAL HIGH (ref 11.5–15.5)
WBC: 8.7 10*3/uL (ref 4.0–10.5)
nRBC: 0 % (ref 0.0–0.2)

## 2021-04-09 LAB — ABO/RH: ABO/RH(D): O POS

## 2021-04-09 NOTE — Assessment & Plan Note (Signed)
This is a source of her bleeding She has appointment to see her primary care doctor for management

## 2021-04-09 NOTE — Assessment & Plan Note (Signed)
She denies bleeding since discharge from hospital She tolerated oral iron supplement well Her anemia is improving She will continue oral B12 and iron supplement Plan to see her again in 3 months for further follow-up

## 2021-04-09 NOTE — Assessment & Plan Note (Signed)
Thrombocytopenia has resolved Observe closely

## 2021-04-09 NOTE — Progress Notes (Signed)
[facility] OFFICE PROGRESS NOTE  Pcp, No  ASSESSMENT & PLAN:  Iron deficiency anemia due to chronic blood loss She denies bleeding since discharge from hospital She tolerated oral iron supplement well Her anemia is improving She will continue oral B12 and iron supplement Plan to see her again in 3 months for further follow-up  Thrombocytopenia ([place]) Thrombocytopenia has resolved Observe closely  Menorrhagia This is a source of her bleeding She has appointment to see her primary care doctor for management  Orders Placed This Encounter  Procedures   Ferritin    Standing Status:   Standing    Number of Occurrences:   2    Standing Expiration Date:   04/09/2022   Iron and TIBC    Standing Status:   Standing    Number of Occurrences:   2    Standing Expiration Date:   04/09/2022    The total time spent in the appointment was 20 minutes encounter with patients including review of chart and various tests results, discussions about plan of care and coordination of care plan   All questions were answered. The patient knows to call the clinic with any problems, questions or concerns. No barriers to learning was detected.    [name], MD 7/15/20222:01 PM  INTERVAL HISTORY: Gabrielle Roberts 50 y.o. female returns for hospital follow-up after recent findings of severe iron deficiency anemia and thrombocytopenia She continues taking oral iron supplement and B12 She tolerated that well She denies further bleeding  SUMMARY OF HEMATOLOGIC HISTORY: Please see my consult note dated April 02, 2021 for further details Gabrielle Roberts is a 50 year old female with a past medical history significant for chronic iron deficiency anemia, menorrhagia, uterine fibroids.  She presented to the emergency department with dizziness.  Dizziness present x3 to 4 days.  She has not been having any chest pain, shortness of breath, diaphoresis, or palpitations.  She has not seen any obvious bleeding.   The patient reported having heavy periods with the first day of her last cycle being about 2-1/2 weeks ago and was associated with her typical 2 to 2-1/2 days of heavy flow with dissipating flow over the next 1 to 2 days.  She has been seen by her gynecologist as an outpatient for fibroids.  And plan was to initiate oral contraceptives and possible hysterectomy at some point in the future for management of her menorrhagia secondary to acute on chronic anemia.  She has not been able to follow-up with her gynecologist due to concern for finances.  She has not been using any blood thinners or aspirin as an outpatient.  She has been taking oral iron once a day.  She also reported a history of a lower GI bleed in July 2020 and underwent a colonoscopy at that time.  She was found to have nonbleeding hemorrhoids on the colonoscopy.  On admission, her hemoglobin was 7.6 and platelets were 89,000.  Her MCV was low at 70.2.  Her renal and liver function were normal.  She received 1 unit PRBCs on 04/01/2021.  Additional lab work showed a ferritin level of 7, iron 164, TIBC 497, percent saturation 33%.  Folate was normal.  LDH normal at 129 and immature reticulocyte fraction was mildly elevated at 17.6%.  Methylmalonic acid and haptoglobin are pending.  Of note, she has significant thrombocytosis in the past   I saw the patient in the emergency department.  Her oldest daughter was at the bedside.  She is  feeling much better today after receiving 1 unit of PRBCs.  She has been up and ambulating without any recurrent dizziness.  She reported that she was having headaches prior to admission which have now resolved.  She denies chest pain, shortness of breath, cough, abdominal pain, nausea, vomiting.  She reports that with her last period she was having up to 3 days of very heavy bleeding and was having to change her tampon or pad every few hours.  She was also passing large clots.  She has not noticed any other bleeding such as  epistaxis, hemoptysis, hematemesis, melena, hematochezia, hematuria.  She tells me that she has received blood transfusions in the past and received IV iron during the hospital admission in 2020. Hematology was asked to see the patient to make recommendations regarding her anemia and mild thrombocytopenia.   The patient has ice pica.  She is not regular diet.  She has 6 pregnancies.  She told me she was anemic during pregnancy as well She received blood transfusion and IV iron while she was hospitalized  I have reviewed the past medical history, past surgical history, social history and family history with the patient and they are unchanged from previous note.  ALLERGIES:  has No Known Allergies.  MEDICATIONS:  Current Outpatient Medications  Medication Sig Dispense Refill   ferrous sulfate 325 (65 FE) MG tablet Take 1 tablet (325 mg total) by mouth 2 (two) times daily with a meal. 120 tablet 3   ondansetron (ZOFRAN ODT) 4 MG disintegrating tablet Take 1 tablet (4 mg total) by mouth every 8 (eight) hours as needed for nausea or vomiting. 20 tablet 0   vitamin B-12 1000 MCG tablet Take 1 tablet (1,000 mcg total) by mouth daily. 30 tablet 3   No current facility-administered medications for this visit.     REVIEW OF SYSTEMS:   Constitutional: Denies fevers, chills or night sweats Eyes: Denies blurriness of vision Ears, nose, mouth, throat, and face: Denies mucositis or sore throat Respiratory: Denies cough, dyspnea or wheezes Cardiovascular: Denies palpitation, chest discomfort or lower extremity swelling Gastrointestinal:  Denies nausea, heartburn or change in bowel habits Skin: Denies abnormal skin rashes Lymphatics: Denies new lymphadenopathy or easy bruising Neurological:Denies numbness, tingling or new weaknesses Behavioral/Psych: Mood is stable, no new changes  All other systems were reviewed with the patient and are negative.  PHYSICAL EXAMINATION: ECOG PERFORMANCE STATUS: 0 -  Asymptomatic  Vitals:   04/09/21 1351  BP: (!) 147/88  Pulse: 68  Resp: 18  Temp: 97.8 F (36.6 C)  SpO2: 100%   Filed Weights   04/09/21 1351  Weight: 214 lb 9.6 oz (97.3 kg)    GENERAL:alert, no distress and comfortable SKIN: skin color, texture, turgor are normal, no rashes or significant lesions EYES: normal, Conjunctiva are pink and non-injected, sclera clear OROPHARYNX:no exudate, no erythema and lips, buccal mucosa, and tongue normal  NECK: supple, thyroid normal size, non-tender, without nodularity LYMPH:  no palpable lymphadenopathy in the cervical, axillary or inguinal LUNGS: clear to auscultation and percussion with normal breathing effort HEART: regular rate & rhythm and no murmurs and no lower extremity edema ABDOMEN:abdomen soft, non-tender and normal bowel sounds Musculoskeletal:no cyanosis of digits and no clubbing  NEURO: alert & oriented x 3 with fluent speech, no focal motor/sensory deficits  LABORATORY DATA:  I have reviewed the data as listed     Component Value Date/Time   NA 137 04/02/2021 0404   NA 143 05/11/2020 1553  K 3.7 04/02/2021 0404   CL 105 04/02/2021 0404   CO2 27 04/02/2021 0404   GLUCOSE 89 04/02/2021 0404   BUN 6 04/02/2021 0404   BUN 10 05/11/2020 1553   CREATININE 0.65 04/02/2021 0404   CALCIUM 8.9 04/02/2021 0404   PROT 6.7 04/02/2021 0404   ALBUMIN 3.4 (L) 04/02/2021 0404   AST 18 04/02/2021 0404   ALT 10 04/02/2021 0404   ALKPHOS 42 04/02/2021 0404   BILITOT 0.7 04/02/2021 0404   GFRNONAA >60 04/02/2021 0404   GFRAA 100 05/11/2020 1553    No results found for: SPEP, UPEP  Lab Results  Component Value Date   WBC 8.7 04/09/2021   NEUTROABS PENDING 04/09/2021   HGB 9.7 (L) 04/09/2021   HCT 34.6 (L) 04/09/2021   MCV 76.0 (L) 04/09/2021   PLT 213 04/09/2021      Chemistry      Component Value Date/Time   NA 137 04/02/2021 0404   NA 143 05/11/2020 1553   K 3.7 04/02/2021 0404   CL 105 04/02/2021 0404   CO2 27  04/02/2021 0404   BUN 6 04/02/2021 0404   BUN 10 05/11/2020 1553   CREATININE 0.65 04/02/2021 0404      Component Value Date/Time   CALCIUM 8.9 04/02/2021 0404   ALKPHOS 42 04/02/2021 0404   AST 18 04/02/2021 0404   ALT 10 04/02/2021 0404   BILITOT 0.7 04/02/2021 0404

## 2021-04-19 ENCOUNTER — Other Ambulatory Visit: Payer: Self-pay

## 2021-04-19 ENCOUNTER — Ambulatory Visit (INDEPENDENT_AMBULATORY_CARE_PROVIDER_SITE_OTHER): Payer: BC Managed Care – PPO | Admitting: Obstetrics & Gynecology

## 2021-04-19 ENCOUNTER — Encounter (HOSPITAL_BASED_OUTPATIENT_CLINIC_OR_DEPARTMENT_OTHER): Payer: Self-pay | Admitting: Obstetrics & Gynecology

## 2021-04-19 VITALS — BP 145/88 | HR 80 | Resp 16

## 2021-04-19 DIAGNOSIS — Z1231 Encounter for screening mammogram for malignant neoplasm of breast: Secondary | ICD-10-CM

## 2021-04-19 DIAGNOSIS — D500 Iron deficiency anemia secondary to blood loss (chronic): Secondary | ICD-10-CM | POA: Diagnosis not present

## 2021-04-19 DIAGNOSIS — D251 Intramural leiomyoma of uterus: Secondary | ICD-10-CM | POA: Diagnosis not present

## 2021-04-19 DIAGNOSIS — D252 Subserosal leiomyoma of uterus: Secondary | ICD-10-CM

## 2021-04-19 DIAGNOSIS — N920 Excessive and frequent menstruation with regular cycle: Secondary | ICD-10-CM

## 2021-04-19 MED ORDER — TRANEXAMIC ACID 650 MG PO TABS: 1300.0000 mg | ORAL_TABLET | Freq: Three times a day (TID) | ORAL | 2 refills | Status: DC

## 2021-04-19 NOTE — Progress Notes (Signed)
50 y.o. [id number] Single Black or African American female here for discussion of bleeding.  I last say pt in 2020 due to menorrhagia.  She was seen due to hb noted in the ER.  She was transfused 4 units of blood.  CT done showing fibroids.  Ultrasound then completed showing 13.3 x 7.6 x 7.9cm fibroid uterus.  Volume was 417.8.  Endometrial biopsy 03/2019 showed benign endometrial polyp.  She was seen again in the ER on 04/01/2021 due to symptomatic anemia.  She was transfused again and given IV iron.  She is now taking oral iron.  Is being followed by Dr. Alvy Bimler as well.  Hb on 7/15 was improved at 9.7.  Pt reports she is feeling better.  Since I saw her last, she reports  her menstrual cycles are still regular and last about a week.  The first two days are the heavy days with needing to change pads every 1-2 hours and passage of clots.  Pt is not desirous of any surgical treatment.  She states that today and that was the case in 2020 as well.  In 2020, we discussed other treatment options.  She was most interested in an endometrial ablation but the failure rate is very high with fibroids the size her hers at that time.  It is likely that the fibroids are larger now.  Oral and injectable progesterones, Mirena IUD, [place], RFA treatment of fibroids, hysterectomy briefly discussed.  Will get updated ultrasound.  Pt reports if bleeding was just more manageable and improved, she would be very satisfied this that.  The current method of family planning is tubal ligation.    Smoker:  no  Health Maintenance: Pap:  ASCUS with neg HR HPV 03/2019.  Due again next year. History of abnormal Pap:  no MMG:  needs to be scheduled.  Importance discussed Colonoscopy:  03/2019 with Oletta Lamas, repeat 10 years   reports that she has never smoked. She has never used smokeless tobacco. She reports current alcohol use. She reports that she does not use drugs.  Past Medical History:  Diagnosis Date   Anemia    Menorrhagia      Past Surgical History:  Procedure Laterality Date   COLONOSCOPY WITH PROPOFOL N/A 04/03/2019   Procedure: COLONOSCOPY WITH PROPOFOL;  Surgeon: Laurence Spates, MD;  Location: [facility];  Service: Gastroenterology;  Laterality: N/A;   ORIF ZYGOMATIC FRACTURE  01/27/2012   Procedure: OPEN REDUCTION INTERNAL FIXATION (ORIF) ZYGOMATIC FRACTURE;  Surgeon: Ascencion Dike, MD;  Location: [facility];  Service: ENT;  Laterality: Right;  repair left lip laceration   SALPINGECTOMY  2001   due to ectopic pregnancy.  Unsure of side. laparotomy.   TUBAL LIGATION  2003    Current Outpatient Medications  Medication Sig Dispense Refill   tranexamic acid (LYSTEDA) 650 MG TABS tablet Take 2 tablets (1,300 mg total) by mouth 3 (three) times daily. 30 tablet 2   ferrous sulfate 325 (65 FE) MG tablet Take 1 tablet (325 mg total) by mouth 2 (two) times daily with a meal. 120 tablet 3   ondansetron (ZOFRAN ODT) 4 MG disintegrating tablet Take 1 tablet (4 mg total) by mouth every 8 (eight) hours as needed for nausea or vomiting. 20 tablet 0   vitamin B-12 1000 MCG tablet Take 1 tablet (1,000 mcg total) by mouth daily. 30 tablet 3   No current facility-administered medications for this visit.    Family History  Problem Relation Age of Onset  Diabetes Mother    Hypertension Mother    Diabetes Son     Review of Systems  Constitutional:  Positive for fatigue.  Respiratory: Negative.    Cardiovascular: Negative.   Genitourinary:  Positive for menstrual problem.   Exam:   BP (!) 145/88   Pulse 80   Resp 16      Physical Exam Constitutional:      Appearance: Normal appearance.  Pulmonary:     Effort: Pulmonary effort is normal.  Skin:    General: Skin is warm.  Neurological:     General: No focal deficit present.     Mental Status: She is alert.    Assessment/Plan: 1. Menorrhagia with regular cycle - will recheck to see size of uterus and fibroids for decisions about treatment - done with  childbearing - US PELVIC COMPLETE WITH TRANSVAGINAL; Future - options for treatment discussed.  Will begin with Lysteda '1300mg'$  tid with onset of cycle for up to 5 days.    2. Iron deficiency anemia due to chronic blood loss - being followed by Dr. Alvy Bimler - on oral iron  3. Intramural and subserous leiomyoma of uterus  4. Encounter for screening mammogram for malignant neoplasm of - MM 3D SCREEN BREAST BILATERAL; Future

## 2021-04-21 ENCOUNTER — Encounter (HOSPITAL_BASED_OUTPATIENT_CLINIC_OR_DEPARTMENT_OTHER): Payer: Self-pay | Admitting: Obstetrics & Gynecology

## 2021-04-21 ENCOUNTER — Other Ambulatory Visit: Payer: Self-pay

## 2021-04-21 ENCOUNTER — Encounter: Payer: Self-pay | Admitting: Hematology and Oncology

## 2021-04-21 ENCOUNTER — Ambulatory Visit (HOSPITAL_BASED_OUTPATIENT_CLINIC_OR_DEPARTMENT_OTHER)
Admission: RE | Admit: 2021-04-21 | Discharge: 2021-04-21 | Disposition: A | Discharge status: Home / Self Care | Payer: BC Managed Care – PPO | Source: Ambulatory Visit | Attending: Obstetrics & Gynecology | Admitting: Obstetrics & Gynecology

## 2021-04-21 DIAGNOSIS — D500 Iron deficiency anemia secondary to blood loss (chronic): Secondary | ICD-10-CM | POA: Insufficient documentation

## 2021-04-21 DIAGNOSIS — N920 Excessive and frequent menstruation with regular cycle: Secondary | ICD-10-CM

## 2021-04-21 DIAGNOSIS — Z1231 Encounter for screening mammogram for malignant neoplasm of breast: Secondary | ICD-10-CM | POA: Insufficient documentation

## 2021-04-21 DIAGNOSIS — D259 Leiomyoma of uterus, unspecified: Secondary | ICD-10-CM | POA: Diagnosis not present

## 2021-04-21 DIAGNOSIS — N83201 Unspecified ovarian cyst, right side: Secondary | ICD-10-CM | POA: Diagnosis not present

## 2021-04-21 DIAGNOSIS — D251 Intramural leiomyoma of uterus: Secondary | ICD-10-CM

## 2021-04-21 DIAGNOSIS — D252 Subserosal leiomyoma of uterus: Secondary | ICD-10-CM | POA: Insufficient documentation

## 2021-04-23 ENCOUNTER — Other Ambulatory Visit: Payer: Self-pay | Admitting: Obstetrics & Gynecology

## 2021-04-23 DIAGNOSIS — R928 Other abnormal and inconclusive findings on diagnostic imaging of breast: Secondary | ICD-10-CM

## 2021-04-27 ENCOUNTER — Other Ambulatory Visit (HOSPITAL_BASED_OUTPATIENT_CLINIC_OR_DEPARTMENT_OTHER): Payer: Self-pay

## 2021-04-27 MED ORDER — NORETHINDRONE 0.35 MG PO TABS: 1.0000 | ORAL_TABLET | Freq: Every day | ORAL | 2 refills | Status: DC

## 2021-05-01 ENCOUNTER — Other Ambulatory Visit: Payer: Self-pay

## 2021-05-01 ENCOUNTER — Other Ambulatory Visit: Payer: Self-pay | Admitting: Obstetrics & Gynecology

## 2021-05-01 ENCOUNTER — Ambulatory Visit
Admission: RE | Admit: 2021-05-01 | Discharge: 2021-05-01 | Disposition: A | Discharge status: Home / Self Care | Payer: BC Managed Care – PPO | Source: Ambulatory Visit | Attending: Obstetrics & Gynecology | Admitting: Obstetrics & Gynecology

## 2021-05-01 DIAGNOSIS — R922 Inconclusive mammogram: Secondary | ICD-10-CM | POA: Diagnosis not present

## 2021-05-01 DIAGNOSIS — R921 Mammographic calcification found on diagnostic imaging of breast: Secondary | ICD-10-CM | POA: Diagnosis not present

## 2021-05-01 DIAGNOSIS — R928 Other abnormal and inconclusive findings on diagnostic imaging of breast: Secondary | ICD-10-CM

## 2021-06-14 ENCOUNTER — Other Ambulatory Visit: Payer: Self-pay

## 2021-06-14 ENCOUNTER — Ambulatory Visit
Admission: EM | Admit: 2021-06-14 | Discharge: 2021-06-14 | Disposition: A | Discharge status: Home / Self Care | Payer: BC Managed Care – PPO | Attending: Internal Medicine | Admitting: Internal Medicine

## 2021-06-14 ENCOUNTER — Ambulatory Visit (INDEPENDENT_AMBULATORY_CARE_PROVIDER_SITE_OTHER): Payer: BC Managed Care – PPO

## 2021-06-14 DIAGNOSIS — S63696A Other sprain of right little finger, initial encounter: Secondary | ICD-10-CM

## 2021-06-14 DIAGNOSIS — S63694A Other sprain of right ring finger, initial encounter: Secondary | ICD-10-CM | POA: Diagnosis not present

## 2021-06-14 DIAGNOSIS — M79641 Pain in right hand: Secondary | ICD-10-CM | POA: Diagnosis not present

## 2021-06-14 NOTE — Discharge Instructions (Signed)
Your x-ray was negative for fracture or dislocation.  Suspect that you have a finger sprain.  You may follow-up with provided contact information for orthopedist if pain persists.  You may take Tylenol for pain since you are unable to take ibuprofen.  Please also use ice application to help with pain and inflammation.

## 2021-06-14 NOTE — ED Triage Notes (Signed)
Pt states fell Saturday night catching herself with her rt hand. Pt c/o pain and swelling to rt 4th and 5th digit.

## 2021-06-14 NOTE — ED Provider Notes (Signed)
[facility]    CSN: [id number] Arrival date & time: 06/14/21  0802      History   Chief Complaint Chief Complaint  Patient presents with   Finger Injury    HPI Gabrielle Roberts is a 50 y.o. female.   Patient presents with right finger pain that has been present since a fall that occurred approximately 2 days ago.  Patient states that she stepped down from a curb, lost her footing, and fell and landed on her right hand.  Currently having pain in the right fourth and fifth digits.  Denies any numbness or tingling.  Denies any pain in the remainder of the hand, wrist, arm.  Denies hitting her head or losing consciousness.  Patient also has elevated blood pressure reading during triage.  Denies any headache, chest pain, shortness of breath, dizziness, blurred vision, nausea, vomiting.  Patient states that her blood pressure is typically elevated when she comes to the doctor.    Past Medical History:  Diagnosis Date   Anemia    Menorrhagia     Patient Active Problem List   Diagnosis Date Noted   Acute on chronic anemia 04/01/2021   Menorrhagia    Thrombocytopenia ([facility])    Dizziness    Iron deficiency anemia due to chronic blood loss 05/11/2020   Lower GI bleed 04/01/2019   Symptomatic anemia 04/01/2019   Elevated blood pressure reading 04/01/2019   Thrombocytosis 04/01/2019    Past Surgical History:  Procedure Laterality Date   COLONOSCOPY WITH PROPOFOL N/A 04/03/2019   Procedure: COLONOSCOPY WITH PROPOFOL;  Surgeon: Laurence Spates, MD;  Location: [facility];  Service: Gastroenterology;  Laterality: N/A;   ORIF ZYGOMATIC FRACTURE  01/27/2012   Procedure: OPEN REDUCTION INTERNAL FIXATION (ORIF) ZYGOMATIC FRACTURE;  Surgeon: Ascencion Dike, MD;  Location: [facility];  Service: ENT;  Laterality: Right;  repair left lip laceration   SALPINGECTOMY  2001   due to ectopic pregnancy.  Unsure of side. laparotomy.   TUBAL LIGATION  2003    OB History     Gravida  7    Para  6   Term  6   Preterm      AB  1   Living  6      SAB      IAB      Ectopic  1   Multiple      Live Births  6            Home Medications    Prior to Admission medications   Medication Sig Start Date End Date Taking? Authorizing Provider  ferrous sulfate 325 (65 FE) MG tablet Take 1 tablet (325 mg total) by mouth 2 (two) times daily with a meal. 05/13/20   Ladell Pier, MD  norethindrone (MICRONOR) 0.35 MG tablet Take 1 tablet (0.35 mg total) by mouth daily. 04/27/21   Megan Salon, MD  tranexamic acid (LYSTEDA) 650 MG TABS tablet Take 2 tablets (1,300 mg total) by mouth 3 (three) times daily. 04/19/21   Megan Salon, MD  vitamin B-12 1000 MCG tablet Take 1 tablet (1,000 mcg total) by mouth daily. 04/03/21   Nita Sells, MD    Family History Family History  Problem Relation Age of Onset   Diabetes Mother    Hypertension Mother    Diabetes Son     Social History Social History   Tobacco Use   Smoking status: Never   Smokeless tobacco: Never  Vaping Use  Vaping Use: Never used  Substance Use Topics   Alcohol use: Yes    Comment: occasionally   Drug use: No     Allergies   Patient has no known allergies.   Review of Systems Review of Systems Per HPI  Physical Exam Triage Vital Signs ED Triage Vitals [06/14/21 0815]  Enc Vitals Group     BP (!) 173/104     Pulse Rate 78     Resp 18     Temp 98.7 F (37.1 C)     Temp Source Oral     SpO2 96 %     Weight      Height      Head Circumference      Peak Flow      Pain Score 7     Pain Loc      Pain Edu?      Excl. in [place]?    No data found.  Updated Vital Signs BP (!) 159/102 (BP Location: Left Arm)   Pulse 78   Temp 98.7 F (37.1 C) (Oral)   Resp 18   LMP 05/14/2021   SpO2 96%   Visual Acuity Right Eye Distance:   Left Eye Distance:   Bilateral Distance:    Right Eye Near:   Left Eye Near:    Bilateral Near:     Physical Exam Constitutional:       Appearance: Normal appearance.  HENT:     Head: Normocephalic and atraumatic.  Eyes:     Extraocular Movements: Extraocular movements intact.     Conjunctiva/sclera: Conjunctivae normal.  Pulmonary:     Effort: Pulmonary effort is normal.  Musculoskeletal:     Right hand: Swelling, tenderness and bony tenderness present. No deformity. Decreased strength. Normal sensation. Normal capillary refill. Normal pulse.     Left hand: Normal.     Comments: Tenderness to palpation to PIP joint of right fourth digit.  Tenderness to palpation throughout right fifth digit.  Associated swelling noted to right fourth and fifth digits.  Neurovascular intact.  Decreased grip strength due to inability to flex right fourth and fifth digits.  Neurological:     General: No focal deficit present.     Mental Status: She is alert and oriented to person, place, and time. Mental status is at baseline.  Psychiatric:        Mood and Affect: Mood normal.        Behavior: Behavior normal.        Thought Content: Thought content normal.        Judgment: Judgment normal.     UC Treatments / Results  Labs (all labs ordered are listed, but only abnormal results are displayed) Labs Reviewed - No data to display  EKG   Radiology DG Hand Complete Right  Result Date: 06/14/2021 CLINICAL DATA:  Fall with right hand pain and swelling at the fourth and fifth digits. EXAM: RIGHT HAND - COMPLETE 3+ VIEW COMPARISON:  09/26/2009 FINDINGS: There is no evidence of fracture or dislocation. Distal interphalangeal joint narrowing and spurring at the little digit. No opaque foreign body. IMPRESSION: Negative for fracture or dislocation. Electronically Signed   By: Jorje Guild M.D.   On: 06/14/2021 08:31    Procedures Procedures (including critical care time)  Medications Ordered in UC Medications - No data to display  Initial Impression / Assessment and Plan / UC Course  I have reviewed the triage vital signs and the  nursing notes.  Pertinent  labs & imaging results that were available during my care of the patient were reviewed by me and considered in my medical decision making (see chart for details).     Right hand x-ray was negative for any acute bony abnormality.  Suspect sprain of right fourth and fifth digits.  Advised patient to apply ice to affected area of pain.  Patient states that she is unable to take ibuprofen, so patient was advised to take Tylenol as needed for pain.  Patient was provided with contact information for orthopedist if pain persists over the next 1.5 to 2 weeks.  No red flags seen on exam.  Suspect that elevated blood pressure is also due to pain, and patient states that blood pressure is typically elevated when she comes to the doctor's office.  Patient to follow-up with PCP for evaluation management of elevated blood pressure.  Discussed strict return precautions. Patient verbalized understanding and is agreeable with plan.  Final Clinical Impressions(s) / UC Diagnoses   Final diagnoses:  Other sprain of right little finger, initial encounter  Other sprain of right ring finger, initial encounter  Right hand pain     Discharge Instructions      Your x-ray was negative for fracture or dislocation.  Suspect that you have a finger sprain.  You may follow-up with provided contact information for orthopedist if pain persists.  You may take Tylenol for pain since you are unable to take ibuprofen.  Please also use ice application to help with pain and inflammation.     ED Prescriptions   None    PDMP not reviewed this encounter.   Odis Luster, [place] 06/14/21 [phone]

## 2021-07-12 ENCOUNTER — Inpatient Hospital Stay: Payer: BC Managed Care – PPO | Attending: Hematology and Oncology

## 2021-07-12 ENCOUNTER — Other Ambulatory Visit: Payer: Self-pay

## 2021-07-12 ENCOUNTER — Encounter: Payer: Self-pay | Admitting: Hematology and Oncology

## 2021-07-12 ENCOUNTER — Inpatient Hospital Stay (HOSPITAL_BASED_OUTPATIENT_CLINIC_OR_DEPARTMENT_OTHER): Payer: BC Managed Care – PPO | Admitting: Hematology and Oncology

## 2021-07-12 ENCOUNTER — Telehealth: Payer: Self-pay

## 2021-07-12 DIAGNOSIS — N920 Excessive and frequent menstruation with regular cycle: Secondary | ICD-10-CM | POA: Insufficient documentation

## 2021-07-12 DIAGNOSIS — D500 Iron deficiency anemia secondary to blood loss (chronic): Secondary | ICD-10-CM

## 2021-07-12 DIAGNOSIS — K909 Intestinal malabsorption, unspecified: Secondary | ICD-10-CM | POA: Insufficient documentation

## 2021-07-12 DIAGNOSIS — N922 Excessive menstruation at puberty: Secondary | ICD-10-CM

## 2021-07-12 DIAGNOSIS — D696 Thrombocytopenia, unspecified: Secondary | ICD-10-CM

## 2021-07-12 LAB — CBC WITH DIFFERENTIAL/PLATELET
Abs Immature Granulocytes: 0.01 10*3/uL (ref 0.00–0.07)
Basophils Absolute: 0 10*3/uL (ref 0.0–0.1)
Basophils Relative: 0 %
Eosinophils Absolute: 0.1 10*3/uL (ref 0.0–0.5)
Eosinophils Relative: 2 %
HCT: 36.1 % (ref 36.0–46.0)
Hemoglobin: 11.1 g/dL — ABNORMAL LOW (ref 12.0–15.0)
Immature Granulocytes: 0 %
Lymphocytes Relative: 31 %
Lymphs Abs: 1.7 10*3/uL (ref 0.7–4.0)
MCH: 26.1 pg (ref 26.0–34.0)
MCHC: 30.7 g/dL (ref 30.0–36.0)
MCV: 84.9 fL (ref 80.0–100.0)
Monocytes Absolute: 0.3 10*3/uL (ref 0.1–1.0)
Monocytes Relative: 6 %
Neutro Abs: 3.3 10*3/uL (ref 1.7–7.7)
Neutrophils Relative %: 61 %
Platelets: 200 10*3/uL (ref 150–400)
RBC: 4.25 MIL/uL (ref 3.87–5.11)
RDW: 16.2 % — ABNORMAL HIGH (ref 11.5–15.5)
WBC: 5.4 10*3/uL (ref 4.0–10.5)
nRBC: 0 % (ref 0.0–0.2)

## 2021-07-12 LAB — IRON AND TIBC
Iron: 34 ug/dL — ABNORMAL LOW (ref 41–142)
Saturation Ratios: 9 % — ABNORMAL LOW (ref 21–57)
TIBC: 385 ug/dL (ref 236–444)
UIBC: 351 ug/dL (ref 120–384)

## 2021-07-12 LAB — SAMPLE TO BLOOD BANK

## 2021-07-12 LAB — FERRITIN: Ferritin: 11 ng/mL (ref 11–307)

## 2021-07-12 NOTE — Telephone Encounter (Signed)
Called and given below message. She verbalized understanding. 

## 2021-07-12 NOTE — Progress Notes (Signed)
[facility] OFFICE PROGRESS NOTE  Pcp, No  ASSESSMENT & PLAN:  Iron deficiency anemia due to chronic blood loss She tolerated IV iron well and has experienced excellent improvement in energy level She denies further pica She continues to have menorrhagia I recommend 2 more doses of IV iron sucrose and will schedule another follow-up in 3 months She is in agreement to proceed  The most likely cause of her anemia is due to chronic blood loss/malabsorption syndrome. We discussed some of the risks, benefits, and alternatives of intravenous iron infusions. The patient is symptomatic from anemia and the iron level is critically low. She tolerated oral iron supplement poorly and desires to achieved higher levels of iron faster for adequate hematopoesis. Some of the side-effects to be expected including risks of infusion reactions, phlebitis, headaches, nausea and fatigue.  The patient is willing to proceed. Patient education material was dispensed.  Goal is to keep ferritin level greater than 50 and resolution of anemia   Menorrhagia This is a source of her bleeding She has an appointment pending to see gynecologist for further evaluation and management  No orders of the defined types were placed in this encounter.   The total time spent in the appointment was 20 minutes encounter with patients including review of chart and various tests results, discussions about plan of care and coordination of care plan   All questions were answered. The patient knows to call the clinic with any problems, questions or concerns. No barriers to learning was detected.    [name], MD 10/17/20229:28 AM  INTERVAL HISTORY: Gabrielle Roberts 50 y.o. female returns for further follow-up on severe iron deficiency anemia due to chronic menorrhagia She received 2 doses of intravenous iron infusion in July without any side effects or complication Her energy level has improved She denies recent  pica She continues to have heavy menstruation and has appointment to see gynecologist in the near future for management The patient denies any recent signs or symptoms of bleeding such as spontaneous epistaxis, hematuria or hematochezia.  SUMMARY OF HEMATOLOGIC HISTORY: Please see my consult note dated April 02, 2021 for further details Gabrielle Roberts is a 50 year old female with a past medical history significant for chronic iron deficiency anemia, menorrhagia, uterine fibroids.  She presented to the emergency department with dizziness.  Dizziness present x3 to 4 days.  She has not been having any chest pain, shortness of breath, diaphoresis, or palpitations.  She has not seen any obvious bleeding.  The patient reported having heavy periods with the first day of her last cycle being about 2-1/2 weeks ago and was associated with her typical 2 to 2-1/2 days of heavy flow with dissipating flow over the next 1 to 2 days.  She has been seen by her gynecologist as an outpatient for fibroids.  And plan was to initiate oral contraceptives and possible hysterectomy at some point in the future for management of her menorrhagia secondary to acute on chronic anemia.  She has not been able to follow-up with her gynecologist due to concern for finances.  She has not been using any blood thinners or aspirin as an outpatient.  She has been taking oral iron once a day.  She also reported a history of a lower GI bleed in July 2020 and underwent a colonoscopy at that time.  She was found to have nonbleeding hemorrhoids on the colonoscopy.  On admission, her hemoglobin was 7.6 and platelets were 89,000.  Her MCV  was low at 70.2.  Her renal and liver function were normal.  She received 1 unit PRBCs on 04/01/2021.  Additional lab work showed a ferritin level of 7, iron 164, TIBC 497, percent saturation 33%.  Folate was normal.  LDH normal at 129 and immature reticulocyte fraction was mildly elevated at 17.6%.  Methylmalonic acid and  haptoglobin are pending.  Of note, she has significant thrombocytosis in the past   I saw the patient in the emergency department.  Her oldest daughter was at the bedside.  She is feeling much better today after receiving 1 unit of PRBCs.  She has been up and ambulating without any recurrent dizziness.  She reported that she was having headaches prior to admission which have now resolved.  She denies chest pain, shortness of breath, cough, abdominal pain, nausea, vomiting.  She reports that with her last period she was having up to 3 days of very heavy bleeding and was having to change her tampon or pad every few hours.  She was also passing large clots.  She has not noticed any other bleeding such as epistaxis, hemoptysis, hematemesis, melena, hematochezia, hematuria.  She tells me that she has received blood transfusions in the past and received IV iron during the hospital admission in 2020. Hematology was asked to see the patient to make recommendations regarding her anemia and mild thrombocytopenia.   The patient has ice pica.  She is not regular diet.  She has 6 pregnancies.  She told me she was anemic during pregnancy as well She received blood transfusion and IV iron while she was hospitalized In July 2022, she received 2 doses of intravenous iron sucrose  I have reviewed the past medical history, past surgical history, social history and family history with the patient and they are unchanged from previous note.  ALLERGIES:  has No Known Allergies.  MEDICATIONS:  Current Outpatient Medications  Medication Sig Dispense Refill   norethindrone (MICRONOR) 0.35 MG tablet Take 1 tablet (0.35 mg total) by mouth daily. 28 tablet 2   tranexamic acid (LYSTEDA) 650 MG TABS tablet Take 2 tablets (1,300 mg total) by mouth 3 (three) times daily. 30 tablet 2   vitamin B-12 1000 MCG tablet Take 1 tablet (1,000 mcg total) by mouth daily. 30 tablet 3   No current facility-administered medications for this  visit.     REVIEW OF SYSTEMS:   Constitutional: Denies fevers, chills or night sweats Eyes: Denies blurriness of vision Ears, nose, mouth, throat, and face: Denies mucositis or sore throat Respiratory: Denies cough, dyspnea or wheezes Cardiovascular: Denies palpitation, chest discomfort or lower extremity swelling Gastrointestinal:  Denies nausea, heartburn or change in bowel habits Skin: Denies abnormal skin rashes Lymphatics: Denies new lymphadenopathy or easy bruising Neurological:Denies numbness, tingling or new weaknesses Behavioral/Psych: Mood is stable, no new changes  All other systems were reviewed with the patient and are negative.  PHYSICAL EXAMINATION: ECOG PERFORMANCE STATUS: 0 - Asymptomatic  Vitals:   07/12/21 0857  BP: 126/84  Pulse: 75  Resp: 18  Temp: (!) 97.5 F (36.4 C)  SpO2: 100%   Filed Weights   07/12/21 0857  Weight: 219 lb 8 oz (99.6 kg)    GENERAL:alert, no distress and comfortable NEURO: alert & oriented x 3 with fluent speech, no focal motor/sensory deficits  LABORATORY DATA:  I have reviewed the data as listed     Component Value Date/Time   NA 137 04/02/2021 0404   NA 143 05/11/2020 1553  K 3.7 04/02/2021 0404   CL 105 04/02/2021 0404   CO2 27 04/02/2021 0404   GLUCOSE 89 04/02/2021 0404   BUN 6 04/02/2021 0404   BUN 10 05/11/2020 1553   CREATININE 0.65 04/02/2021 0404   CALCIUM 8.9 04/02/2021 0404   PROT 6.7 04/02/2021 0404   ALBUMIN 3.4 (L) 04/02/2021 0404   AST 18 04/02/2021 0404   ALT 10 04/02/2021 0404   ALKPHOS 42 04/02/2021 0404   BILITOT 0.7 04/02/2021 0404   GFRNONAA >60 04/02/2021 0404   GFRAA 100 05/11/2020 1553    No results found for: SPEP, UPEP  Lab Results  Component Value Date   WBC 5.4 07/12/2021   NEUTROABS 3.3 07/12/2021   HGB 11.1 (L) 07/12/2021   HCT 36.1 07/12/2021   MCV 84.9 07/12/2021   PLT 200 07/12/2021      Chemistry      Component Value Date/Time   NA 137 04/02/2021 0404   NA 143  05/11/2020 1553   K 3.7 04/02/2021 0404   CL 105 04/02/2021 0404   CO2 27 04/02/2021 0404   BUN 6 04/02/2021 0404   BUN 10 05/11/2020 1553   CREATININE 0.65 04/02/2021 0404      Component Value Date/Time   CALCIUM 8.9 04/02/2021 0404   ALKPHOS 42 04/02/2021 0404   AST 18 04/02/2021 0404   ALT 10 04/02/2021 0404   BILITOT 0.7 04/02/2021 0404

## 2021-07-12 NOTE — Assessment & Plan Note (Signed)
She tolerated IV iron well and has experienced excellent improvement in energy level She denies further pica She continues to have menorrhagia I recommend 2 more doses of IV iron sucrose and will schedule another follow-up in 3 months She is in agreement to proceed  The most likely cause of her anemia is due to chronic blood loss/malabsorption syndrome. We discussed some of the risks, benefits, and alternatives of intravenous iron infusions. The patient is symptomatic from anemia and the iron level is critically low. She tolerated oral iron supplement poorly and desires to achieved higher levels of iron faster for adequate hematopoesis. Some of the side-effects to be expected including risks of infusion reactions, phlebitis, headaches, nausea and fatigue.  The patient is willing to proceed. Patient education material was dispensed.  Goal is to keep ferritin level greater than 50 and resolution of anemia

## 2021-07-12 NOTE — Assessment & Plan Note (Signed)
This is a source of her bleeding She has an appointment pending to see gynecologist for further evaluation and management

## 2021-07-12 NOTE — Telephone Encounter (Signed)
-----   Message from [name], MD sent at 07/12/2021 10:07 AM EDT ----- Pls call her, iron studies indeed are still very low We will proceed with IV iron as planned

## 2021-07-16 ENCOUNTER — Other Ambulatory Visit: Payer: Self-pay | Admitting: Hematology and Oncology

## 2021-07-17 ENCOUNTER — Inpatient Hospital Stay: Payer: BC Managed Care – PPO

## 2021-07-17 ENCOUNTER — Other Ambulatory Visit: Payer: Self-pay

## 2021-07-17 VITALS — BP 130/84 | HR 63 | Temp 98.6°F | Resp 16

## 2021-07-17 DIAGNOSIS — N920 Excessive and frequent menstruation with regular cycle: Secondary | ICD-10-CM | POA: Diagnosis not present

## 2021-07-17 DIAGNOSIS — D696 Thrombocytopenia, unspecified: Secondary | ICD-10-CM | POA: Diagnosis not present

## 2021-07-17 DIAGNOSIS — D500 Iron deficiency anemia secondary to blood loss (chronic): Secondary | ICD-10-CM | POA: Diagnosis not present

## 2021-07-17 DIAGNOSIS — K909 Intestinal malabsorption, unspecified: Secondary | ICD-10-CM | POA: Diagnosis not present

## 2021-07-17 MED ORDER — SODIUM CHLORIDE 0.9 % IV SOLN
300.0000 mg | Freq: Once | INTRAVENOUS | Status: AC
  Administered 2021-07-17: 300 mg via INTRAVENOUS
  Filled 2021-07-17: qty 300

## 2021-07-17 MED ORDER — SODIUM CHLORIDE 0.9 % IV SOLN: Freq: Once | INTRAVENOUS | Status: AC

## 2021-07-17 NOTE — Patient Instructions (Signed)

## 2021-07-30 ENCOUNTER — Other Ambulatory Visit (HOSPITAL_BASED_OUTPATIENT_CLINIC_OR_DEPARTMENT_OTHER): Payer: Self-pay | Admitting: Obstetrics & Gynecology

## 2021-07-30 ENCOUNTER — Other Ambulatory Visit: Payer: Self-pay

## 2021-07-30 ENCOUNTER — Telehealth (INDEPENDENT_AMBULATORY_CARE_PROVIDER_SITE_OTHER): Payer: BC Managed Care – PPO | Admitting: Obstetrics & Gynecology

## 2021-07-30 ENCOUNTER — Ambulatory Visit (HOSPITAL_BASED_OUTPATIENT_CLINIC_OR_DEPARTMENT_OTHER): Payer: BC Managed Care – PPO | Admitting: Obstetrics & Gynecology

## 2021-07-30 ENCOUNTER — Encounter (HOSPITAL_BASED_OUTPATIENT_CLINIC_OR_DEPARTMENT_OTHER): Payer: Self-pay

## 2021-07-30 DIAGNOSIS — D500 Iron deficiency anemia secondary to blood loss (chronic): Secondary | ICD-10-CM

## 2021-07-30 DIAGNOSIS — N922 Excessive menstruation at puberty: Secondary | ICD-10-CM

## 2021-07-30 DIAGNOSIS — N920 Excessive and frequent menstruation with regular cycle: Secondary | ICD-10-CM | POA: Diagnosis not present

## 2021-07-30 MED ORDER — TRANEXAMIC ACID 650 MG PO TABS: 1300.0000 mg | ORAL_TABLET | Freq: Three times a day (TID) | ORAL | 2 refills | Status: DC

## 2021-07-30 MED ORDER — NORETHINDRONE ACETATE 5 MG PO TABS: 5.0000 mg | ORAL_TABLET | Freq: Every day | ORAL | 1 refills | Status: DC

## 2021-07-30 MED FILL — Iron Sucrose Inj 20 MG/ML (Fe Equiv): INTRAVENOUS | Qty: 15 | Status: AC

## 2021-07-30 NOTE — Progress Notes (Signed)
Virtual Visit via Video Note  I connected with Gabrielle Roberts on 07/30/21 at 11:00 AM EDT by a video enabled telemedicine application and verified that I am speaking with the correct person using two identifiers.  Multiple attempts were made at connecting and connection could be obtained but then the connection kept dropping.  After multiple attempt, decision made to change to phone visit.  Pt was comfortable changing this to an audio phone visit.  Location: Patient: home Provider: office   I discussed the limitations of evaluation and management by telemedicine and the availability of in person appointments. The patient expressed understanding and agreed to proceed.  History of Present Illness: 50 yo G7P6 SAA female that has hx of menorrhagia and iron deficiency anemia.  She was seen about 3 months ago and started on micronor and TXA.  She reports she never started the TXA because the pharmacy never filled it.  She took the micronor for two months.  She does not think this helped her cycles at all.     Had iron infusion two weeks ago and then has another scheduled for tomorrow.  Last hb 10/17 and it was 11.1.  She is taking her iron pills.  Reports she isn't eating/craving ice any more.  She does feel better.    Observations/Objective: WNWD AAF, NAD  Assessment and Plan: 1. 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  Dispense: 90 tablet; Refill: 1 - tranexamic acid (LYSTEDA) 650 MG TABS tablet; Take 2 tablets (1,300 mg total) by mouth 3 (three) times daily.  Dispense: 30 tablet; Refill: 2  2. Iron deficiency anemia due to chronic blood loss - has received iron infusion  Follow Up Instructions: - recheck 3 months   I discussed the assessment and treatment plan with the patient. The patient was provided an opportunity to ask questions and all were answered. The  patient agreed with the plan and demonstrated an understanding of the instructions.   The patient was advised to call back or seek an in-person evaluation if the symptoms worsen or if the condition fails to improve as anticipated.  I provided 22 minutes of non-face-to-face time during this encounter.   Megan Salon, MD

## 2021-07-31 ENCOUNTER — Inpatient Hospital Stay: Payer: BC Managed Care – PPO | Attending: Hematology and Oncology

## 2021-07-31 ENCOUNTER — Encounter (HOSPITAL_BASED_OUTPATIENT_CLINIC_OR_DEPARTMENT_OTHER): Payer: Self-pay | Admitting: Obstetrics & Gynecology

## 2021-07-31 ENCOUNTER — Other Ambulatory Visit: Payer: Self-pay

## 2021-07-31 VITALS — BP 134/76 | HR 67 | Temp 98.8°F | Resp 16

## 2021-07-31 DIAGNOSIS — D500 Iron deficiency anemia secondary to blood loss (chronic): Secondary | ICD-10-CM | POA: Insufficient documentation

## 2021-07-31 DIAGNOSIS — N920 Excessive and frequent menstruation with regular cycle: Secondary | ICD-10-CM | POA: Insufficient documentation

## 2021-07-31 MED ORDER — SODIUM CHLORIDE 0.9 % IV SOLN
300.0000 mg | Freq: Once | INTRAVENOUS | Status: AC
  Administered 2021-07-31: 300 mg via INTRAVENOUS
  Filled 2021-07-31: qty 300

## 2021-07-31 MED ORDER — SODIUM CHLORIDE 0.9 % IV SOLN: Freq: Once | INTRAVENOUS | Status: AC

## 2021-07-31 NOTE — Patient Instructions (Signed)

## 2021-08-11 ENCOUNTER — Encounter (HOSPITAL_BASED_OUTPATIENT_CLINIC_OR_DEPARTMENT_OTHER): Payer: Self-pay | Admitting: *Deleted

## 2021-08-25 ENCOUNTER — Encounter (HOSPITAL_BASED_OUTPATIENT_CLINIC_OR_DEPARTMENT_OTHER): Payer: Self-pay | Admitting: Obstetrics & Gynecology

## 2021-08-25 ENCOUNTER — Ambulatory Visit (HOSPITAL_BASED_OUTPATIENT_CLINIC_OR_DEPARTMENT_OTHER): Payer: BC Managed Care – PPO | Admitting: Obstetrics & Gynecology

## 2021-10-28 ENCOUNTER — Ambulatory Visit (HOSPITAL_BASED_OUTPATIENT_CLINIC_OR_DEPARTMENT_OTHER): Payer: BC Managed Care – PPO | Admitting: Obstetrics & Gynecology

## 2021-10-29 ENCOUNTER — Ambulatory Visit (HOSPITAL_BASED_OUTPATIENT_CLINIC_OR_DEPARTMENT_OTHER): Payer: BC Managed Care – PPO | Admitting: Obstetrics & Gynecology

## 2021-11-02 ENCOUNTER — Telehealth: Payer: Self-pay

## 2021-11-02 ENCOUNTER — Other Ambulatory Visit: Payer: Self-pay | Admitting: Obstetrics & Gynecology

## 2021-11-02 ENCOUNTER — Ambulatory Visit
Admission: RE | Admit: 2021-11-02 | Discharge: 2021-11-02 | Disposition: A | Discharge status: Home / Self Care | Payer: BC Managed Care – PPO | Source: Ambulatory Visit | Attending: Obstetrics & Gynecology | Admitting: Obstetrics & Gynecology

## 2021-11-02 ENCOUNTER — Other Ambulatory Visit: Payer: Self-pay

## 2021-11-02 ENCOUNTER — Inpatient Hospital Stay: Payer: BC Managed Care – PPO

## 2021-11-02 ENCOUNTER — Encounter: Payer: Self-pay | Admitting: Hematology and Oncology

## 2021-11-02 ENCOUNTER — Inpatient Hospital Stay: Payer: BC Managed Care – PPO | Admitting: Hematology and Oncology

## 2021-11-02 DIAGNOSIS — R921 Mammographic calcification found on diagnostic imaging of breast: Secondary | ICD-10-CM

## 2021-11-02 DIAGNOSIS — R922 Inconclusive mammogram: Secondary | ICD-10-CM | POA: Diagnosis not present

## 2021-11-02 NOTE — Telephone Encounter (Signed)
Called and left a message asking her to call the office back. She missed lab appt today. Mychart video visit canceled at 1220 with Dr. Alvy Bimler.

## 2021-12-06 IMAGING — US US PELVIS COMPLETE WITH TRANSVAGINAL
1 series · 13 of 25 positions shown · non-contrast
Comparison: Ultrasound from 04/02/2019.

CLINICAL DATA: Initial evaluation for menorrhagia, history of
uterine fibroids.



[Series 1: us pelvic complete with transvaginal · 13 of 84 slices shown]
[im 1/84]
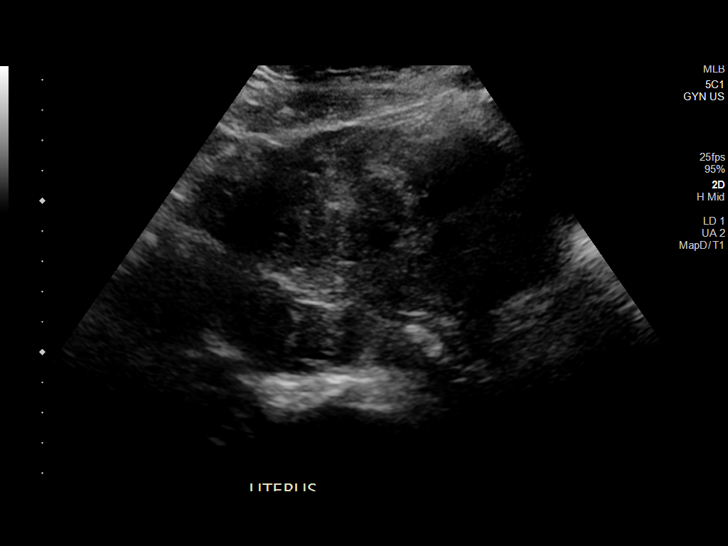
[im 7/84]
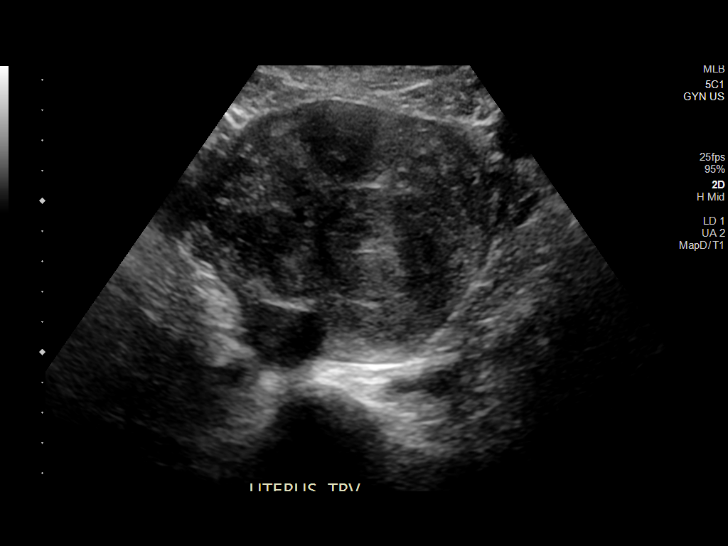
[im 14/84]
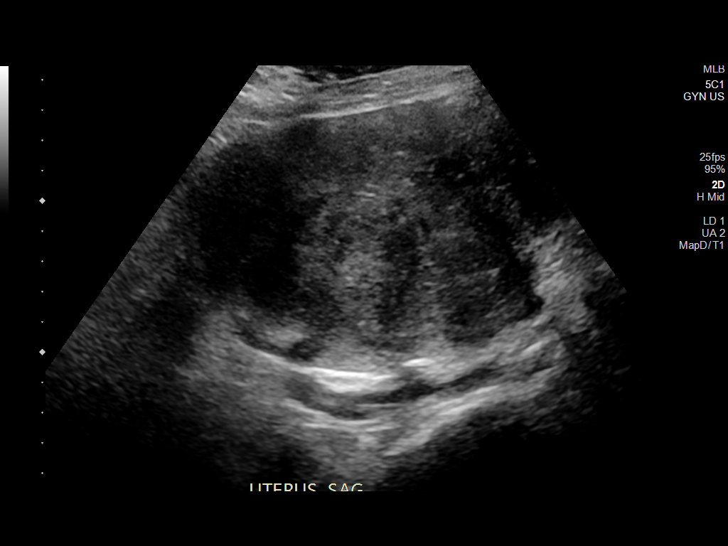
[im 21/84]
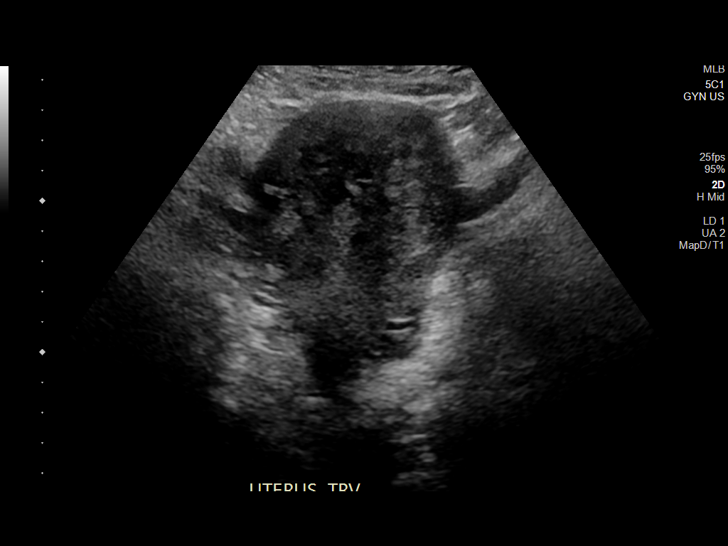
[im 28/84]
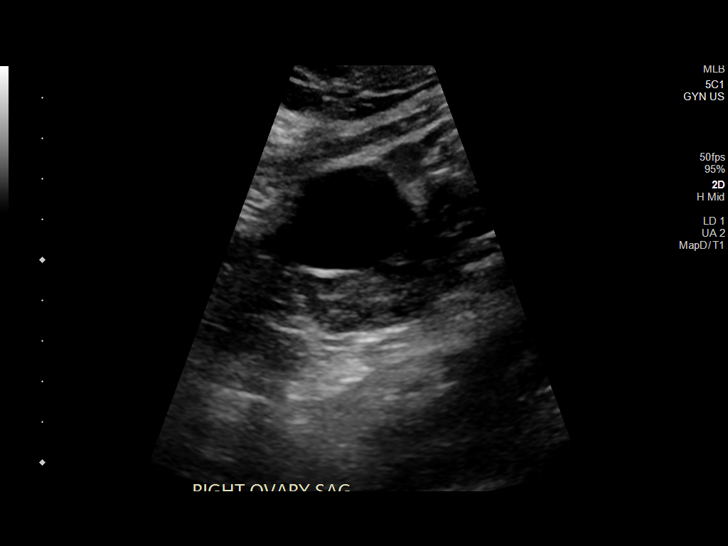
[im 35/84]
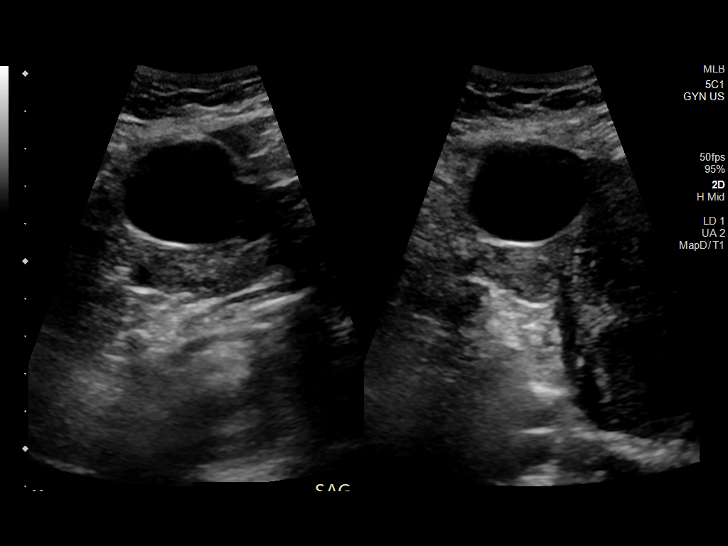
[im 42/84]
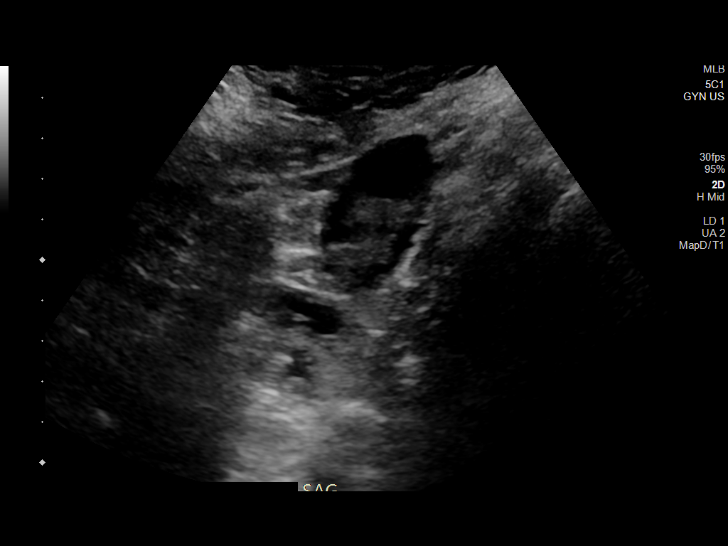
[im 49/84]
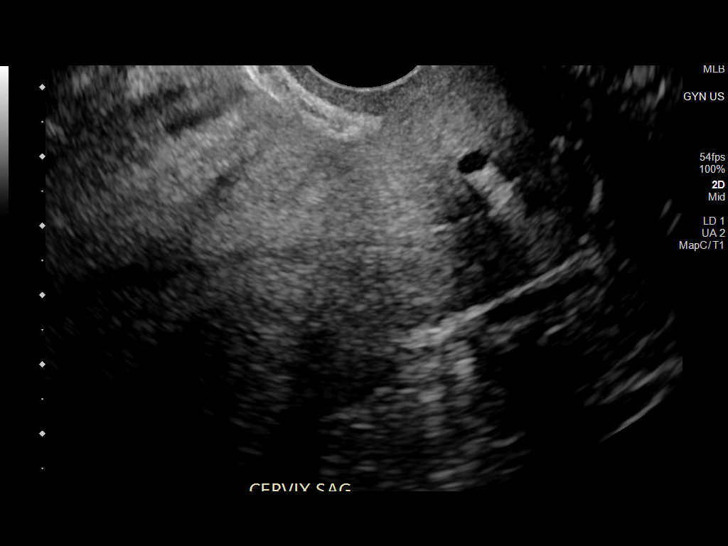
[im 56/84]
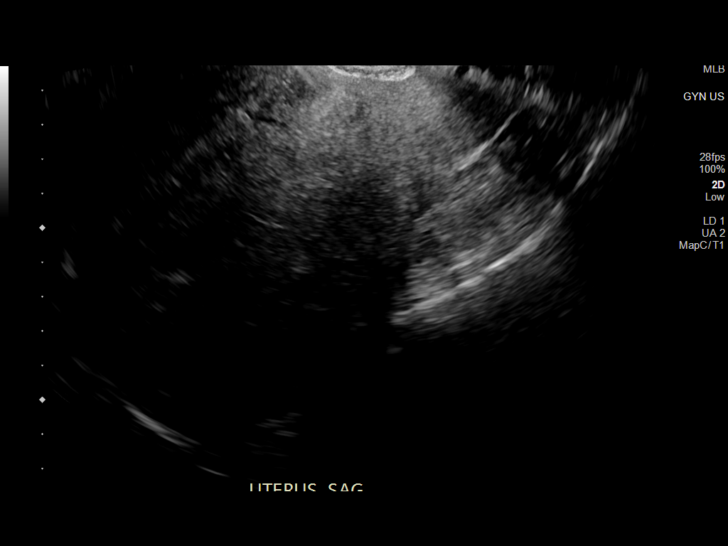
[im 63/84]
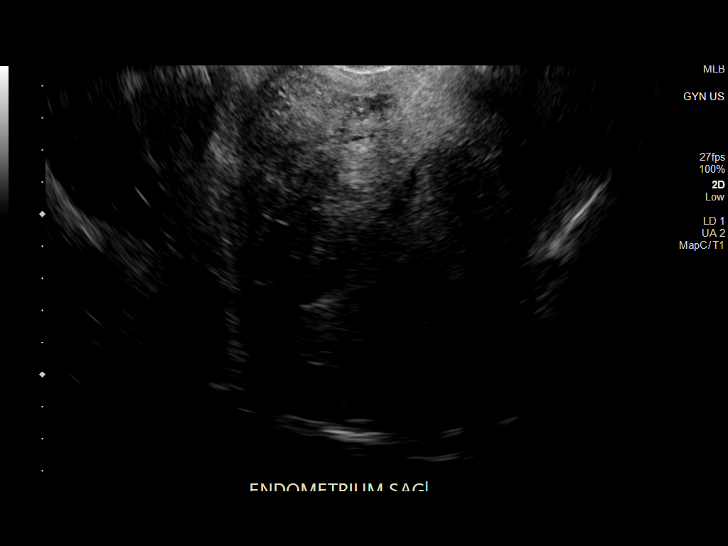
[im 70/84]
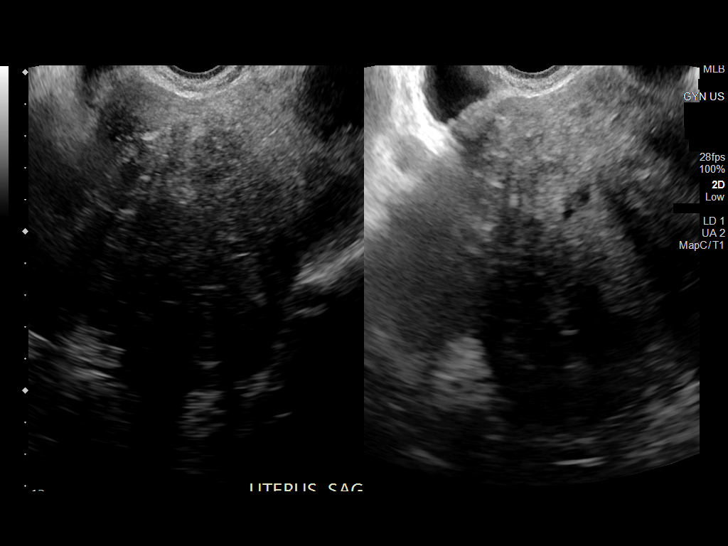
[im 77/84]
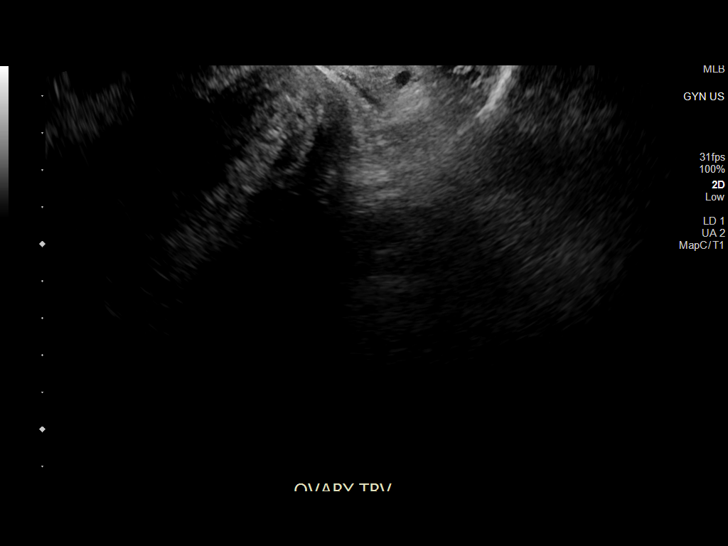
[im 84/84]
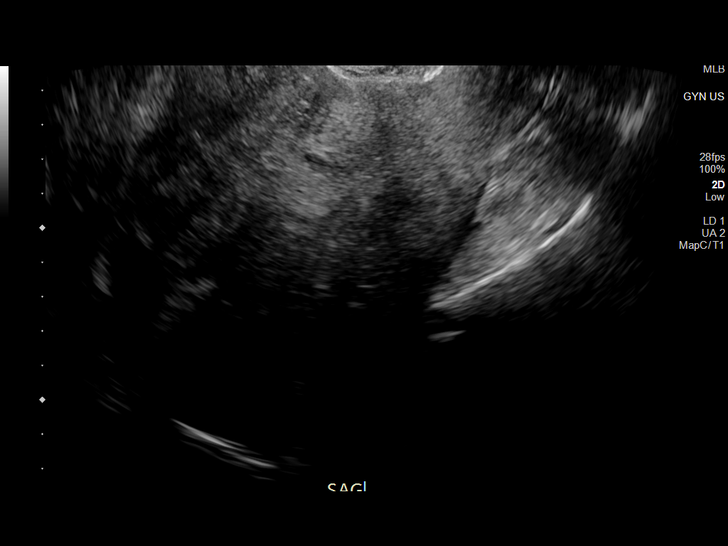

[13 of 25 positions shown; findings below may reference images not displayed]

FINDINGS: Uterus

Measurements: 15.4 x 10.3 x 10.0 cm = volume: 827.9 mL. Uterus is
enlarged and anteverted, with multiple fibroids present, 3 largest
of which are measured.

1. 5.3 x 4.7 x 5.2 cm intramural fibroid present at the right
posterior uterine fundus.
2. 3.9 x 4.2 x 4.1 cm subserosal fibroid at the left anterior
uterine body.
3. 4.6 x 2.6 x 4.2 cm subserosal fibroid at the right posterior
uterine body.

Endometrium

Thickness: 4.2 mm. Endometrial cavity mildly distended with a small
amount of simple anechoic fluid. No other focal abnormality.

Right ovary

Measurements: 4.0 x 4.4 x 3.7 cm = volume: 33.7 mL. 3.3 x 3.0 x
cm simple cyst. No internal vascularity or solid nodularity.

Left ovary

Measurements: 2.4 x 4.2 x 4.0 cm = volume: 21.6 mL. Small dominant
follicle noted. No adnexal mass.

Other findings

No abnormal free fluid.
IMPRESSION: 1. Enlarged fibroid uterus as detailed above.
2. Endometrial stripe measures 4.2 mm in thickness with small volume
simple fluid within the endometrial cavity. If bleeding remains
unresponsive to hormonal or medical therapy, sonohysterogram should
be considered for focal lesion work-up. (Ref: Radiological
Reasoning: Algorithmic Workup of Abnormal Vaginal Bleeding with
Endovaginal Sonography and Sonohysterography. AJR [id number]; 191:S68-73).
3. 3.5 cm simple right ovarian cyst, likely a normal physiologic
follicular cyst. No follow-up imaging recommended. Note: This
recommendation does not apply to premenarchal patients or to those
with increased risk (genetic, family history, elevated tumor markers
or other high-risk factors) of ovarian cancer. Reference: Radiology
[DATE]):[id number].

## 2022-04-29 ENCOUNTER — Ambulatory Visit
Admission: RE | Admit: 2022-04-29 | Discharge: 2022-04-29 | Disposition: A | Discharge status: Home / Self Care | Payer: BC Managed Care – PPO | Source: Ambulatory Visit | Attending: Obstetrics & Gynecology | Admitting: Obstetrics & Gynecology

## 2022-04-29 DIAGNOSIS — R921 Mammographic calcification found on diagnostic imaging of breast: Secondary | ICD-10-CM

## 2022-11-01 DIAGNOSIS — Z7251 High risk heterosexual behavior: Secondary | ICD-10-CM | POA: Diagnosis not present

## 2022-11-01 DIAGNOSIS — Z3202 Encounter for pregnancy test, result negative: Secondary | ICD-10-CM | POA: Diagnosis not present

## 2023-06-10 ENCOUNTER — Encounter (HOSPITAL_COMMUNITY): Payer: Self-pay

## 2023-06-10 ENCOUNTER — Ambulatory Visit (HOSPITAL_COMMUNITY)
Admission: EM | Admit: 2023-06-10 | Discharge: 2023-06-10 | Disposition: A | Discharge status: Home / Self Care | Payer: BC Managed Care – PPO | Attending: Emergency Medicine | Admitting: Emergency Medicine

## 2023-06-10 DIAGNOSIS — N3001 Acute cystitis with hematuria: Secondary | ICD-10-CM

## 2023-06-10 DIAGNOSIS — R519 Headache, unspecified: Secondary | ICD-10-CM

## 2023-06-10 LAB — POCT URINALYSIS DIP (MANUAL ENTRY)
Bilirubin, UA: NEGATIVE
Glucose, UA: NEGATIVE mg/dL
Ketones, POC UA: NEGATIVE mg/dL
Nitrite, UA: NEGATIVE
Protein Ur, POC: 100 mg/dL — AB
Spec Grav, UA: 1.02 (ref 1.010–1.025)
Urobilinogen, UA: 0.2 U/dL
pH, UA: 7.5 (ref 5.0–8.0)

## 2023-06-10 LAB — POCT URINE PREGNANCY: Preg Test, Ur: NEGATIVE

## 2023-06-10 MED ORDER — IBUPROFEN 800 MG PO TABS: 800.0000 mg | ORAL_TABLET | Freq: Three times a day (TID) | ORAL | 0 refills | Status: AC

## 2023-06-10 MED ORDER — CEPHALEXIN 500 MG PO CAPS: 500.0000 mg | ORAL_CAPSULE | Freq: Two times a day (BID) | ORAL | 0 refills | Status: AC

## 2023-06-10 NOTE — ED Triage Notes (Signed)
Pain when breathing and headaches x 2 days. No cough or other sick symptoms. No history of respiratory issues. No one around the Patient sick or with similar symptoms.   Patient has tried some tylenol with no relief.

## 2023-06-10 NOTE — Discharge Instructions (Signed)
Please start taking antibiotic as prescribed. We are going to send a urine culture and someone will call you if we need to adjust your treatment plan. You can take ibuprofen and Tylenol as needed for headache. If your symptoms worsen please go to the ER for further evaluation. Return here as needed.

## 2023-06-10 NOTE — ED Provider Notes (Signed)
[facility]    CSN: [id number] Arrival date & time: 06/10/23  1423      History   Chief Complaint Chief Complaint  Patient presents with   Headache   Shortness of Breath    HPI Gabrielle Roberts is a 52 y.o. female.   Patient presents with headache and pain under her ribs and to mid back upon deep breathing. Endorses some intermittent abdominal pain. Denies cough, congestion, shortness of breath, fever, nausea, vomiting, diarrhea, dysuria, and hematuria.    Headache Associated symptoms: abdominal pain and back pain   Associated symptoms: no congestion, no diarrhea, no dizziness, no fatigue, no fever, no nausea, no sore throat, no vomiting and no weakness   Shortness of Breath Associated symptoms: abdominal pain, chest pain and headaches   Associated symptoms: no fever, no sore throat, no vomiting and no wheezing     Past Medical History:  Diagnosis Date   Anemia    Menorrhagia     Patient Active Problem List   Diagnosis Date Noted   Menorrhagia    Thrombocytopenia (HCC)    Dizziness    Iron deficiency anemia due to chronic blood loss 05/11/2020   Lower GI bleed 04/01/2019   Symptomatic anemia 04/01/2019   Elevated blood pressure reading 04/01/2019   Thrombocytosis 04/01/2019    Past Surgical History:  Procedure Laterality Date   COLONOSCOPY WITH PROPOFOL N/A 04/03/2019   Procedure: COLONOSCOPY WITH PROPOFOL;  Surgeon: Carman Ching, MD;  Location: [facility];  Service: Gastroenterology;  Laterality: N/A;   ORIF ZYGOMATIC FRACTURE  01/27/2012   Procedure: OPEN REDUCTION INTERNAL FIXATION (ORIF) ZYGOMATIC FRACTURE;  Surgeon: Darletta Moll, MD;  Location: [facility];  Service: ENT;  Laterality: Right;  repair left lip laceration   SALPINGECTOMY  2001   due to ectopic pregnancy.  Unsure of side. laparotomy.   TUBAL LIGATION  2003    OB History     Gravida  7   Para  6   Term  6   Preterm      AB  1   Living  6      SAB      IAB       Ectopic  1   Multiple      Live Births  6            Home Medications    Prior to Admission medications   Medication Sig Start Date End Date Taking? Authorizing Provider  cephALEXin (KEFLEX) 500 MG capsule Take 1 capsule (500 mg total) by mouth 2 (two) times daily for 7 days. 06/10/23 06/17/23 Yes Susann Givens, Ousmane Seeman A, NP  ibuprofen (ADVIL) 800 MG tablet Take 1 tablet (800 mg total) by mouth 3 (three) times daily. 06/10/23  Yes Letta Kocher, NP    Family History Family History  Problem Relation Age of Onset   Diabetes Mother    Hypertension Mother    Diabetes Son     Social History Social History   Tobacco Use   Smoking status: Never   Smokeless tobacco: Never  Vaping Use   Vaping status: Never Used  Substance Use Topics   Alcohol use: Yes    Comment: occasionally   Drug use: No     Allergies   Patient has no known allergies.   Review of Systems Review of Systems  Constitutional:  Negative for chills, fatigue and fever.  HENT:  Negative for congestion, rhinorrhea and sore throat.   Respiratory:  Negative for  choking, chest tightness, shortness of breath and wheezing.   Cardiovascular:  Positive for chest pain.  Gastrointestinal:  Positive for abdominal pain. Negative for diarrhea, nausea and vomiting.  Genitourinary:  Positive for flank pain. Negative for decreased urine volume, difficulty urinating, dysuria, frequency, hematuria, pelvic pain, urgency, vaginal bleeding, vaginal discharge and vaginal pain.  Musculoskeletal:  Positive for back pain.  Skin:  Negative for color change.  Neurological:  Positive for headaches. Negative for dizziness, weakness and light-headedness.     Physical Exam Triage Vital Signs ED Triage Vitals  Encounter Vitals Group     BP 06/10/23 1541 (!) 145/95     Systolic BP Percentile --      Diastolic BP Percentile --      Pulse Rate 06/10/23 1541 98     Resp 06/10/23 1541 18     Temp 06/10/23 1541 99.4 F (37.4 C)      Temp Source 06/10/23 1541 Oral     SpO2 06/10/23 1541 98 %     Weight 06/10/23 1541 219 lb (99.3 kg)     Height 06/10/23 1541 5\' 2"  (1.575 m)     Head Circumference --      Peak Flow --      Pain Score 06/10/23 1538 8     Pain Loc --      Pain Education --      Exclude from Growth Chart --    No data found.  Updated Vital Signs BP (!) 145/95 (BP Location: Right Arm)   Pulse 98   Temp 99.4 F (37.4 C) (Oral)   Resp 18   Ht 5\' 2"  (1.575 m)   Wt 219 lb (99.3 kg)   LMP 05/19/2023 (Exact Date)   SpO2 98%   BMI 40.06 kg/m   Visual Acuity Right Eye Distance:   Left Eye Distance:   Bilateral Distance:    Right Eye Near:   Left Eye Near:    Bilateral Near:     Physical Exam Vitals and nursing note reviewed.  Constitutional:      General: She is awake. She is not in acute distress.    Appearance: Normal appearance. She is well-developed and well-groomed. She is not ill-appearing, toxic-appearing or diaphoretic.  HENT:     Head: Normocephalic.     Right Ear: Tympanic membrane, ear canal and external ear normal.     Left Ear: Tympanic membrane, ear canal and external ear normal.     Mouth/Throat:     Mouth: Mucous membranes are moist.     Pharynx: Oropharynx is clear.  Eyes:     Extraocular Movements: Extraocular movements intact.     Conjunctiva/sclera: Conjunctivae normal.     Pupils: Pupils are equal, round, and reactive to light.  Cardiovascular:     Rate and Rhythm: Normal rate.     Heart sounds: Normal heart sounds.  Pulmonary:     Effort: Pulmonary effort is normal. No respiratory distress.     Breath sounds: Normal breath sounds. No decreased breath sounds or wheezing.  Chest:     Chest wall: Tenderness present. No deformity, swelling or edema.     Comments: Mild tenderness up under ribs. Abdominal:     General: Bowel sounds are normal. There is no distension.     Palpations: Abdomen is soft. There is no mass.     Tenderness: There is abdominal tenderness in  the right upper quadrant, right lower quadrant, suprapubic area and left lower quadrant. There is right  CVA tenderness. There is no left CVA tenderness, guarding or rebound. Negative signs include Murphy's sign, Rovsing's sign and McBurney's sign.     Hernia: No hernia is present.  Musculoskeletal:        General: Normal range of motion.     Cervical back: Normal range of motion.  Skin:    General: Skin is warm and dry.  Neurological:     Mental Status: She is alert and oriented to person, place, and time.     GCS: GCS eye subscore is 4. GCS verbal subscore is 5. GCS motor subscore is 6.     Cranial Nerves: Cranial nerves 2-12 are intact.     Sensory: Sensation is intact.     Motor: Motor function is intact.     Coordination: Coordination is intact.     Gait: Gait is intact.  Psychiatric:        Attention and Perception: Attention normal.        Mood and Affect: Mood normal.        Speech: Speech normal.        Behavior: Behavior normal. Behavior is cooperative.        Thought Content: Thought content normal.        Cognition and Memory: Cognition normal.        Judgment: Judgment normal.      UC Treatments / Results  Labs (all labs ordered are listed, but only abnormal results are displayed) Labs Reviewed  POCT URINALYSIS DIP (MANUAL ENTRY) - Abnormal; Notable for the following components:      Result Value   Clarity, UA hazy (*)    Blood, UA moderate (*)    Protein Ur, POC =100 (*)    Leukocytes, UA Trace (*)    All other components within normal limits  URINE CULTURE  POCT URINE PREGNANCY    EKG   Radiology No results found.  Procedures Procedures (including critical care time)  Medications Ordered in UC Medications - No data to display  Initial Impression / Assessment and Plan / UC Course  I have reviewed the triage vital signs and the nursing notes.  Pertinent labs & imaging results that were available during my care of the patient were reviewed by me and  considered in my medical decision making (see chart for details).     Patient presented 2-day history of with headache and pain under her ribs into mid back upon deep breathing.  Endorses some intermittent abdominal pain.  Denies cough, congestion, shortness of breath, fever, nausea, vomiting, diarrhea, dysuria, and hematuria. Upon assessment mild right-sided CVA tenderness noted, and mild tenderness upon palpation to the lower back abdomen.  No neurodeficits noted. Denies dizziness, confusion, blurred vision, nausea weakness. UA ordered and showed trace leukocytes, protein, blood. Due to history of pyelonephritis, borderline tachycardia and slight elevation of temperature I prescribed Keflex for UTI coverage. Sent urine culture. Discussed return and ER precautions. Final Clinical Impressions(s) / UC Diagnoses   Final diagnoses:  Acute cystitis with hematuria  Bad headache     Discharge Instructions      Please start taking antibiotic as prescribed. We are going to send a urine culture and someone will call you if we need to adjust your treatment plan. You can take ibuprofen and Tylenol as needed for headache. If your symptoms worsen please go to the ER for further evaluation. Return here as needed.    ED Prescriptions     Medication Sig Dispense Auth. Provider  cephALEXin (KEFLEX) 500 MG capsule Take 1 capsule (500 mg total) by mouth 2 (two) times daily for 7 days. 14 capsule Susann Givens, Yobana Culliton A, NP   ibuprofen (ADVIL) 800 MG tablet Take 1 tablet (800 mg total) by mouth 3 (three) times daily. 21 tablet Wynonia Lawman A, NP      PDMP not reviewed this encounter.   Wynonia Lawman A, NP 06/10/23 1727

## 2023-06-12 LAB — URINE CULTURE: Culture: 30000 — AB

## 2023-07-07 ENCOUNTER — Emergency Department (HOSPITAL_COMMUNITY)
Admission: EM | Admit: 2023-07-07 | Discharge: 2023-07-07 | Disposition: A | Discharge status: Home / Self Care | Payer: BC Managed Care – PPO | Attending: Emergency Medicine | Admitting: Emergency Medicine

## 2023-07-07 ENCOUNTER — Emergency Department (HOSPITAL_COMMUNITY): Payer: BC Managed Care – PPO

## 2023-07-07 ENCOUNTER — Other Ambulatory Visit: Payer: Self-pay

## 2023-07-07 DIAGNOSIS — R519 Headache, unspecified: Secondary | ICD-10-CM | POA: Diagnosis not present

## 2023-07-07 DIAGNOSIS — R0602 Shortness of breath: Secondary | ICD-10-CM | POA: Diagnosis not present

## 2023-07-07 DIAGNOSIS — Z1152 Encounter for screening for COVID-19: Secondary | ICD-10-CM | POA: Diagnosis not present

## 2023-07-07 DIAGNOSIS — R531 Weakness: Secondary | ICD-10-CM | POA: Insufficient documentation

## 2023-07-07 DIAGNOSIS — D649 Anemia, unspecified: Secondary | ICD-10-CM | POA: Diagnosis not present

## 2023-07-07 LAB — CBC WITH DIFFERENTIAL/PLATELET
Abs Immature Granulocytes: 0.02 10*3/uL (ref 0.00–0.07)
Basophils Absolute: 0 10*3/uL (ref 0.0–0.1)
Basophils Relative: 0 %
Eosinophils Absolute: 0 10*3/uL (ref 0.0–0.5)
Eosinophils Relative: 0 %
HCT: 27.2 % — ABNORMAL LOW (ref 36.0–46.0)
Hemoglobin: 7.3 g/dL — ABNORMAL LOW (ref 12.0–15.0)
Immature Granulocytes: 0 %
Lymphocytes Relative: 11 %
Lymphs Abs: 1 10*3/uL (ref 0.7–4.0)
MCH: 18.5 pg — ABNORMAL LOW (ref 26.0–34.0)
MCHC: 26.8 g/dL — ABNORMAL LOW (ref 30.0–36.0)
MCV: 68.9 fL — ABNORMAL LOW (ref 80.0–100.0)
Monocytes Absolute: 0.7 10*3/uL (ref 0.1–1.0)
Monocytes Relative: 8 %
Neutro Abs: 7 10*3/uL (ref 1.7–7.7)
Neutrophils Relative %: 81 %
Platelets: 141 10*3/uL — ABNORMAL LOW (ref 150–400)
RBC: 3.95 MIL/uL (ref 3.87–5.11)
RDW: 27.3 % — ABNORMAL HIGH (ref 11.5–15.5)
WBC: 8.7 10*3/uL (ref 4.0–10.5)
nRBC: 0 % (ref 0.0–0.2)

## 2023-07-07 LAB — COMPREHENSIVE METABOLIC PANEL
ALT: 9 U/L (ref 0–44)
AST: 17 U/L (ref 15–41)
Albumin: 3.9 g/dL (ref 3.5–5.0)
Alkaline Phosphatase: 52 U/L (ref 38–126)
Anion gap: 11 (ref 5–15)
BUN: 10 mg/dL (ref 6–20)
CO2: 24 mmol/L (ref 22–32)
Calcium: 8.6 mg/dL — ABNORMAL LOW (ref 8.9–10.3)
Chloride: 100 mmol/L (ref 98–111)
Creatinine, Ser: 0.76 mg/dL (ref 0.44–1.00)
GFR, Estimated: >60 mL/min (ref >60)
Glucose, Bld: 104 mg/dL — ABNORMAL HIGH (ref 70–99)
Potassium: 3.1 mmol/L — ABNORMAL LOW (ref 3.5–5.1)
Sodium: 135 mmol/L (ref 135–145)
Total Bilirubin: 0.3 mg/dL (ref 0.3–1.2)
Total Protein: 7.8 g/dL (ref 6.5–8.1)

## 2023-07-07 LAB — HCG, SERUM, QUALITATIVE: Preg, Serum: NEGATIVE

## 2023-07-07 LAB — RESP PANEL BY RT-PCR (RSV, FLU A&B, COVID)  RVPGX2
Influenza A by PCR: NEGATIVE
Influenza B by PCR: NEGATIVE
Resp Syncytial Virus by PCR: NEGATIVE
SARS Coronavirus 2 by RT PCR: NEGATIVE

## 2023-07-07 LAB — TROPONIN I (HIGH SENSITIVITY)
Troponin I (High Sensitivity): 2 ng/L (ref <18)
Troponin I (High Sensitivity): 3 ng/L (ref <18)

## 2023-07-07 LAB — I-STAT CG4 LACTIC ACID, ED
Lactic Acid, Venous: 0.5 mmol/L (ref 0.5–1.9)
Lactic Acid, Venous: 1.2 mmol/L (ref 0.5–1.9)

## 2023-07-07 MED ORDER — FERROUS SULFATE 325 (65 FE) MG PO TABS: 325.0000 mg | ORAL_TABLET | Freq: Every day | ORAL | 0 refills | Status: AC

## 2023-07-07 MED ORDER — ACETAMINOPHEN 325 MG PO TABS
650.0000 mg | ORAL_TABLET | Freq: Once | ORAL | Status: AC | PRN
  Administered 2023-07-07: 650 mg via ORAL
  Filled 2023-07-07: qty 2

## 2023-07-07 MED ORDER — ALBUTEROL SULFATE HFA 108 (90 BASE) MCG/ACT IN AERS: 2.0000 | INHALATION_SPRAY | RESPIRATORY_TRACT | Status: DC | PRN

## 2023-07-07 NOTE — Discharge Instructions (Signed)
You were seen in the emergency department with symptoms related likely to your anemia.  We discussed restarting iron tablets and following with your primary care doctor in the next 1 to 2 weeks for repeat blood work.  If your blood counts continue to drift downward you may ultimately require a blood transfusion.  Hopefully we will be able to avoid that with additional iron supplementations.   If you begin to experience heavy bleeding or symptoms significantly worsen you should return to the emergency department for reevaluation.

## 2023-07-07 NOTE — ED Triage Notes (Signed)
Pt report SOB, chest pain, dizziness, chills, and headache x 3 days. No cough.

## 2023-07-07 NOTE — ED Provider Notes (Signed)
Emergency Department Provider Note   I have reviewed the triage vital signs and the nursing notes.   HISTORY  Chief Complaint Shortness of Breath   HPI Gabrielle Roberts is a 52 y.o. female with PMH of menorrhagia and anemia presents to the ED weakness, SOB, and HA. Symptoms have been ongoing for the last 3 days.  Denies fever although arrived with 1.  Feeling mostly fatigue and short of breath.  She feels that her iron may be drifting low.  She has fibroids and the menstrual cycles although they are irregular.  She does follow with a GYN but has not seen them for the past couple of years.  She is no longer taking her iron supplements.  She is not having any active bleeding.  No syncope.   Past Medical History:  Diagnosis Date   Anemia    Menorrhagia     Review of Systems  Constitutional: No fever/chills Cardiovascular: Positive chest pain. Respiratory: Positive  shortness of breath. Gastrointestinal: No abdominal pain.  No nausea, no vomiting.   Genitourinary: Negative for dysuria.  Musculoskeletal: Negative for back pain. Skin: Negative for rash. Neurological: Positive HA.   ____________________________________________   PHYSICAL EXAM:  VITAL SIGNS: ED Triage Vitals [07/07/23 0011]  Encounter Vitals Group     BP (!) 161/116     Pulse Rate (!) 108     Resp (!) 21     Temp (!) 100.8 F (38.2 C)     Temp Source Oral     SpO2 100 %     Weight 219 lb (99.3 kg)     Height 5\' 2"  (1.575 m)   Constitutional: Alert and oriented. Well appearing and in no acute distress. Eyes: Conjunctivae are normal. Head: Atraumatic. Nose: No congestion/rhinnorhea. Mouth/Throat: Mucous membranes are moist.  Neck: No stridor.   Cardiovascular: Normal rate, regular rhythm. Good peripheral circulation. Grossly normal heart sounds.   Respiratory: Normal respiratory effort.  No retractions. Lungs CTAB. Gastrointestinal: Soft and nontender. No distention.  Musculoskeletal: No gross  deformities of extremities. Neurologic:  Normal speech and language.  Skin:  Skin is warm, dry and intact. No rash noted.  ____________________________________________   LABS (all labs ordered are listed, but only abnormal results are displayed)  Labs Reviewed  COMPREHENSIVE METABOLIC PANEL - Abnormal; Notable for the following components:      Result Value   Potassium 3.1 (*)    Glucose, Bld 104 (*)    Calcium 8.6 (*)    All other components within normal limits  CBC WITH DIFFERENTIAL/PLATELET - Abnormal; Notable for the following components:   Hemoglobin 7.3 (*)    HCT 27.2 (*)    MCV 68.9 (*)    MCH 18.5 (*)    MCHC 26.8 (*)    RDW 27.3 (*)    Platelets 141 (*)    All other components within normal limits  RESP PANEL BY RT-PCR (RSV, FLU A&B, COVID)  RVPGX2  HCG, SERUM, QUALITATIVE  I-STAT CG4 LACTIC ACID, ED  I-STAT CG4 LACTIC ACID, ED  TROPONIN I (HIGH SENSITIVITY)  TROPONIN I (HIGH SENSITIVITY)   ____________________________________________  EKG   EKG Interpretation Date/Time:  Friday July 07 2023 04:30:31 EDT Ventricular Rate:  97 PR Interval:  173 QRS Duration:  82 QT Interval:  400 QTC Calculation: 509 R Axis:   -23  Text Interpretation: Sinus rhythm Borderline left axis deviation Low voltage, precordial leads Consider anterior infarct Confirmed by Alona Bene [phone]) on 07/07/2023 7:16:39 AM  ____________________________________________  RADIOLOGY  DG Chest 2 View  Result Date: 07/07/2023 CLINICAL DATA:  Pt report SOB, chest pain, dizziness, chills, and headache x 3 days. No cough EXAM: CHEST - 2 VIEW COMPARISON:  None Available. FINDINGS: The heart and mediastinal contours are unchanged. No focal consolidation. No pulmonary edema. No pleural effusion. No pneumothorax. No acute osseous abnormality. IMPRESSION: No active cardiopulmonary disease. Electronically Signed   By: Tish Frederickson M.D.   On: 07/07/2023 01:28     ____________________________________________   PROCEDURES  Procedure(s) performed:   Procedures  None  ____________________________________________   INITIAL IMPRESSION / ASSESSMENT AND PLAN / ED COURSE  Pertinent labs & imaging results that were available during my care of the patient were reviewed by me and considered in my medical decision making (see chart for details).   This patient is Presenting for Evaluation of CP, which does require a range of treatment options, and is a complaint that involves a high risk of morbidity and mortality.  The Differential Diagnoses includes but is not exclusive to acute coronary syndrome, aortic dissection, pulmonary embolism, cardiac tamponade, community-acquired pneumonia, pericarditis, musculoskeletal chest wall pain, etc.   Critical Interventions-    Medications  acetaminophen (TYLENOL) tablet 650 mg (650 mg Oral Given 07/07/23 0051)    Reassessment after intervention: symptoms improved.      I did obtain Additional Historical Information from husband at bedside.   I decided to review pertinent External Data, and in summary no labs in our system since 2022.   Clinical Laboratory Tests Ordered, included CBC shows hemoglobin of 7.3 with low MCV.  Consistent with history of known iron deficiency anemia, likely from heavy menstrual cycles.  No active bleeding or hemodynamic instability.  No acute kidney injury.  Troponin and lactic acid normal.  Respiratory viral panel negative.   Radiologic Tests Ordered, included CXR. I independently interpreted the images and agree with radiology interpretation.   Cardiac Monitor Tracing which shows NSR.    Social Determinants of Health Risk patient is a non-smoker.   Consult complete with  Medical Decision Making: Summary:  Patient presents emergency department for evaluation of weakness and shortness of breath.  Mild chest pain although nothing active.  EKG and troponin reassuring.  We do  think that the patient's anemia is contributing to her symptoms as her hemoglobin has drifted down slightly to 7.3.  She has been noncompliant with her iron supplements which I will refill.  She has required iron infusions in the past and is nearing the point where we would consider a blood transfusion.  I do not think that she requires one at on an emergent basis but we discussed that she will call her PCP/GYN for repeat labs in the next 1-2 weeks after re-starting Fe.  Patient also with a fever on arrival.  No SIRS vitals.  Eval for sepsis is reassuring and unremarkable.  Doubt sepsis.  Supple developing viral infection which also may be contributing to symptoms.  Discussed supportive care, Tylenol as needed.  Patient is comfortable with the plan at discharge.  Patient's presentation is most consistent with acute, uncomplicated illness.   Disposition: discharge  ____________________________________________  FINAL CLINICAL IMPRESSION(S) / ED DIAGNOSES  Final diagnoses:  Symptomatic anemia    NEW OUTPATIENT MEDICATIONS STARTED DURING THIS VISIT:  New Prescriptions   FERROUS SULFATE 325 (65 FE) MG TABLET    Take 1 tablet (325 mg total) by mouth daily.    Note:  This document was prepared using Dragon voice  recognition software and may include unintentional dictation errors.  Alona Bene, MD, [facility] Emergency Medicine    Jeancarlos Marchena, Arlyss Repress, MD 07/07/23 [phone]

## 2023-08-08 ENCOUNTER — Telehealth: Payer: Self-pay

## 2023-08-08 NOTE — Telephone Encounter (Signed)
Transition Care Management Unsuccessful Follow-up Telephone Call  Date of discharge and from where:  07/07/2023 [facility]  Attempts:  1st Attempt  Reason for unsuccessful TCM follow-up call:  No answer/busy voicemail does not pickup.  Riannon Mukherjee [facility]  [facility], [facility] Guide Direct Dial: [phone]  Website: [email or web address]

## 2023-08-08 NOTE — Telephone Encounter (Signed)
Transition Care Management Unsuccessful Follow-up Telephone Call  Date of discharge and from where:  07/07/2023 [facility]  Attempts:  2nd Attempt  Reason for unsuccessful TCM follow-up call:  Left voice message  Beyza Bellino [facility]  [facility], [facility] Resource Care Guide Direct Dial: [phone]  Website: [email or web address]

## 2023-08-22 ENCOUNTER — Ambulatory Visit
Admission: EM | Admit: 2023-08-22 | Discharge: 2023-08-22 | Disposition: A | Discharge status: Home / Self Care | Payer: BC Managed Care – PPO | Attending: Family Medicine | Admitting: Family Medicine

## 2023-08-22 DIAGNOSIS — Z113 Encounter for screening for infections with a predominantly sexual mode of transmission: Secondary | ICD-10-CM | POA: Insufficient documentation

## 2023-08-22 LAB — POCT URINALYSIS DIP (MANUAL ENTRY)
Bilirubin, UA: NEGATIVE
Blood, UA: NEGATIVE
Glucose, UA: NEGATIVE mg/dL
Ketones, POC UA: NEGATIVE mg/dL
Nitrite, UA: NEGATIVE
Protein Ur, POC: NEGATIVE mg/dL
Spec Grav, UA: 1.02 (ref 1.010–1.025)
Urobilinogen, UA: 0.2 U/dL
pH, UA: 7 (ref 5.0–8.0)

## 2023-08-22 MED ORDER — CEFTRIAXONE SODIUM 500 MG IJ SOLR
500.0000 mg | Freq: Once | INTRAMUSCULAR | Status: AC
  Administered 2023-08-22: 500 mg via INTRAMUSCULAR

## 2023-08-22 MED ORDER — DOXYCYCLINE HYCLATE 100 MG PO CAPS: 100.0000 mg | ORAL_CAPSULE | Freq: Two times a day (BID) | ORAL | 0 refills | Status: AC

## 2023-08-22 NOTE — Discharge Instructions (Addendum)
STD test will results will within 3-4 days. You are being treated today for possible exposure to gonorrhea with Rocephin 500 mg. You have been prescribed doxycycline 100 mg twice daily for 7 days this will cover for Chlamydia. If any additional treatment is warranted following receipt of your blood work and cytology we will notify you.

## 2023-08-22 NOTE — ED Triage Notes (Signed)
Patient states her boyfriend was treated yesterday for STI, he is having penal discharge. Patient states no symptoms, want to be tested and treated.

## 2023-08-22 NOTE — ED Provider Notes (Signed)
[facility]    CSN: [id number] Arrival date & time: 08/22/23  1101      History   Chief Complaint No chief complaint on file.   HPI Gabrielle Roberts is a 52 y.o. female.   HPI Patient presents today for STD testing after being exposed to STDs by her significant other.  She reports he was treated yesterday here at this clinic with Rocephin and doxycycline and she is requesting the same treatment.  She is also requesting lab work today.  She denies any symptoms related to STDs or UTI. Patient's last menstrual period was 07/18/2023.    Past Medical History:  Diagnosis Date   Anemia    Menorrhagia     Patient Active Problem List   Diagnosis Date Noted   Menorrhagia    Thrombocytopenia (HCC)    Dizziness    Iron deficiency anemia due to chronic blood loss 05/11/2020   Lower GI bleed 04/01/2019   Symptomatic anemia 04/01/2019   Elevated blood pressure reading 04/01/2019   Thrombocytosis 04/01/2019    Past Surgical History:  Procedure Laterality Date   COLONOSCOPY WITH PROPOFOL N/A 04/03/2019   Procedure: COLONOSCOPY WITH PROPOFOL;  Surgeon: Carman Ching, MD;  Location: [facility];  Service: Gastroenterology;  Laterality: N/A;   ORIF ZYGOMATIC FRACTURE  01/27/2012   Procedure: OPEN REDUCTION INTERNAL FIXATION (ORIF) ZYGOMATIC FRACTURE;  Surgeon: Darletta Moll, MD;  Location: [facility];  Service: ENT;  Laterality: Right;  repair left lip laceration   SALPINGECTOMY  2001   due to ectopic pregnancy.  Unsure of side. laparotomy.   TUBAL LIGATION  2003    OB History     Gravida  7   Para  6   Term  6   Preterm      AB  1   Living  6      SAB      IAB      Ectopic  1   Multiple      Live Births  6            Home Medications    Prior to Admission medications   Medication Sig Start Date End Date Taking? Authorizing Provider  doxycycline (VIBRAMYCIN) 100 MG capsule Take 1 capsule (100 mg total) by mouth 2 (two) times daily for 7 days.  08/22/23 08/29/23 Yes Bing Neighbors, NP  ferrous sulfate 325 (65 FE) MG tablet Take 1 tablet (325 mg total) by mouth daily. 07/07/23   Long, Arlyss Repress, MD  ibuprofen (ADVIL) 800 MG tablet Take 1 tablet (800 mg total) by mouth 3 (three) times daily. 06/10/23   Letta Kocher, NP    Family History Family History  Problem Relation Age of Onset   Diabetes Mother    Hypertension Mother    Diabetes Son     Social History Social History   Tobacco Use   Smoking status: Never   Smokeless tobacco: Never  Vaping Use   Vaping status: Never Used  Substance Use Topics   Alcohol use: Yes    Comment: occasionally   Drug use: No     Allergies   Patient has no known allergies.   Review of Systems Review of Systems Pertinent negatives listed in HPI   Physical Exam Triage Vital Signs ED Triage Vitals  Encounter Vitals Group     BP 08/22/23 1351 (!) 150/101     Systolic BP Percentile --      Diastolic BP Percentile --  Pulse Rate 08/22/23 1351 72     Resp 08/22/23 1351 18     Temp 08/22/23 1351 98.9 F (37.2 C)     Temp Source 08/22/23 1351 Oral     SpO2 08/22/23 1351 100 %     Weight 08/22/23 1349 215 lb (97.5 kg)     Height 08/22/23 1349 5\' 2"  (1.575 m)     Head Circumference --      Peak Flow --      Pain Score 08/22/23 1349 0     Pain Loc --      Pain Education --      Exclude from Growth Chart --    No data found.  Updated Vital Signs BP (!) 150/101 (BP Location: Left Arm)   Pulse 72   Temp 98.9 F (37.2 C) (Oral)   Resp 18   Ht 5\' 2"  (1.575 m)   Wt 215 lb (97.5 kg)   LMP 07/18/2023   SpO2 100%   BMI 39.32 kg/m   Visual Acuity Right Eye Distance:   Left Eye Distance:   Bilateral Distance:    Right Eye Near:   Left Eye Near:    Bilateral Near:     Physical Exam Vitals reviewed.  Constitutional:      Appearance: Normal appearance.  HENT:     Head: Normocephalic and atraumatic.     Nose: Nose normal.  Eyes:     Extraocular Movements:  Extraocular movements intact.     Pupils: Pupils are equal, round, and reactive to light.  Cardiovascular:     Rate and Rhythm: Normal rate and regular rhythm.  Pulmonary:     Effort: Pulmonary effort is normal.     Breath sounds: Normal breath sounds.  Musculoskeletal:        General: Normal range of motion.     Cervical back: Normal range of motion.  Skin:    General: Skin is warm.  Neurological:     General: No focal deficit present.     Mental Status: She is alert.      UC Treatments / Results  Labs (all labs ordered are listed, but only abnormal results are displayed) Labs Reviewed  HIV ANTIBODY (ROUTINE TESTING W REFLEX)  RPR  POCT URINALYSIS DIP (MANUAL ENTRY)  CERVICOVAGINAL ANCILLARY ONLY    EKG   Radiology No results found.  Procedures Procedures (including critical care time)  Medications Ordered in UC Medications  cefTRIAXone (ROCEPHIN) injection 500 mg (has no administration in time range)    Initial Impression / Assessment and Plan / UC Course  I have reviewed the triage vital signs and the nursing notes.  Pertinent labs & imaging results that were available during my care of the patient were reviewed by me and considered in my medical decision making (see chart for details).    Screen for STD, given recent exposure, covering with Doxycycline and Rocephin. Patient advised that labs will result within 24-72 hours.  Return as needed. Final diagnoses:  Screen for STD (sexually transmitted disease)     Discharge Instructions      STD test will results will within 3-4 days. You are being treated today for possible exposure to gonorrhea with Rocephin 500 mg. You have been prescribed doxycycline 100 mg twice daily for 7 days this will cover for Chlamydia. If any additional treatment is warranted following receipt of your blood work and cytology we will notify you.     ED Prescriptions     Medication  Sig Dispense Auth. Provider    doxycycline (VIBRAMYCIN) 100 MG capsule Take 1 capsule (100 mg total) by mouth 2 (two) times daily for 7 days. 14 capsule Bing Neighbors, NP      PDMP not reviewed this encounter.   Bing Neighbors, NP 08/22/23 1422

## 2023-08-23 LAB — CERVICOVAGINAL ANCILLARY ONLY
Bacterial Vaginitis (gardnerella): NEGATIVE
Chlamydia: NEGATIVE
Comment: NEGATIVE
Comment: NEGATIVE
Comment: NEGATIVE
Comment: NORMAL
Neisseria Gonorrhea: NEGATIVE
Trichomonas: NEGATIVE

## 2023-08-23 LAB — HIV ANTIBODY (ROUTINE TESTING W REFLEX): HIV Screen 4th Generation wRfx: NONREACTIVE

## 2023-08-23 LAB — RPR: RPR Ser Ql: NONREACTIVE

## 2024-02-01 ENCOUNTER — Ambulatory Visit
Admission: EM | Admit: 2024-02-01 | Discharge: 2024-02-01 | Disposition: A | Discharge status: Home / Self Care | Attending: Physician Assistant | Admitting: Physician Assistant

## 2024-02-01 ENCOUNTER — Other Ambulatory Visit: Payer: Self-pay

## 2024-02-01 DIAGNOSIS — M25561 Pain in right knee: Secondary | ICD-10-CM

## 2024-02-01 DIAGNOSIS — M25461 Effusion, right knee: Secondary | ICD-10-CM

## 2024-02-01 MED ORDER — PREDNISONE 20 MG PO TABS: ORAL_TABLET | ORAL | 0 refills | Status: AC

## 2024-02-01 NOTE — ED Triage Notes (Addendum)
 Pt presents with complaints of right knee pain x 4 days. Pt currently rates her overall knee pain an 8/10. Pt describes the pain as sharp, shooting pains that is beginning to radiate down to the ankle and up to the thigh area (only on the right side). OTC Ibuprofen  taken today with no relief/improvement in pain. Denies recent injury to right lower extremity.

## 2024-02-01 NOTE — ED Provider Notes (Signed)
 [facility]    CSN: [id number] Arrival date & time: 02/01/24  1804      History   Chief Complaint Chief Complaint  Patient presents with   Knee Pain    HPI Gabrielle Roberts is a 53 y.o. female.   HPI  She reports concerns for right knee pain for about 4 days She denies injuries or trauma to the area She reports today when she woke up she started to have shooting pain down her leg and into her thigh She reports swelling and decreased ROM  She states she has increased pain with extension She denies previous hx of knee injuries or issues, she has broken the right ankle previously Interventions: Ibuprofen  but this did not provide much relief. She also applied ice to the area but this did not provide much relief either    Past Medical History:  Diagnosis Date   Anemia    Menorrhagia     Patient Active Problem List   Diagnosis Date Noted   Menorrhagia    Thrombocytopenia (HCC)    Dizziness    Iron  deficiency anemia due to chronic blood loss 05/11/2020   Lower GI bleed 04/01/2019   Symptomatic anemia 04/01/2019   Elevated blood pressure reading 04/01/2019   Thrombocytosis 04/01/2019    Past Surgical History:  Procedure Laterality Date   COLONOSCOPY WITH PROPOFOL  N/A 04/03/2019   Procedure: COLONOSCOPY WITH PROPOFOL ;  Surgeon: Jolinda Necessary, MD;  Location: [facility];  Service: Gastroenterology;  Laterality: N/A;   ORIF ZYGOMATIC FRACTURE  01/27/2012   Procedure: OPEN REDUCTION INTERNAL FIXATION (ORIF) ZYGOMATIC FRACTURE;  Surgeon: Lawence Press, MD;  Location: [facility];  Service: ENT;  Laterality: Right;  repair left lip laceration   SALPINGECTOMY  2001   due to ectopic pregnancy.  Unsure of side. laparotomy.   TUBAL LIGATION  2003    OB History     Gravida  7   Para  6   Term  6   Preterm      AB  1   Living  6      SAB      IAB      Ectopic  1   Multiple      Live Births  6            Home Medications    Prior to  Admission medications   Medication Sig Start Date End Date Taking? Authorizing Provider  predniSONE (DELTASONE) 20 MG tablet Take 60mg  PO daily x 2 days, then40mg  PO daily x 2 days, then 20mg  PO daily x 3 days 02/01/24  Yes Cherrelle Plante E, PA-C  ferrous sulfate  325 (65 FE) MG tablet Take 1 tablet (325 mg total) by mouth daily. 07/07/23   Long, Shereen Dike, MD  ibuprofen  (ADVIL ) 800 MG tablet Take 1 tablet (800 mg total) by mouth 3 (three) times daily. 06/10/23   Karon Packer, NP    Family History Family History  Problem Relation Age of Onset   Diabetes Mother    Hypertension Mother    Diabetes Son     Social History Social History   Tobacco Use   Smoking status: Never   Smokeless tobacco: Never  Vaping Use   Vaping status: Never Used  Substance Use Topics   Alcohol use: Yes    Comment: occasionally   Drug use: No     Allergies   Patient has no known allergies.   Review of Systems Review of Systems  Musculoskeletal:  Positive for arthralgias.     Physical Exam Triage Vital Signs ED Triage Vitals  Encounter Vitals Group     BP 02/01/24 1818 (!) 163/92     Systolic BP Percentile --      Diastolic BP Percentile --      Pulse Rate 02/01/24 1818 72     Resp 02/01/24 1818 20     Temp 02/01/24 1818 98.4 F (36.9 C)     Temp Source 02/01/24 1818 Oral     SpO2 02/01/24 1818 98 %     Weight 02/01/24 1819 217 lb (98.4 kg)     Height 02/01/24 1819 5\' 2"  (1.575 m)     Head Circumference --      Peak Flow --      Pain Score 02/01/24 1818 8     Pain Loc --      Pain Education --      Exclude from Growth Chart --    No data found.  Updated Vital Signs BP (!) 163/92 (BP Location: Right Arm)   Pulse 72   Temp 98.4 F (36.9 C) (Oral)   Resp 20   Ht 5\' 2"  (1.575 m)   Wt 217 lb (98.4 kg)   LMP 12/09/2023 (Exact Date)   SpO2 98%   BMI 39.69 kg/m   Visual Acuity Right Eye Distance:   Left Eye Distance:   Bilateral Distance:    Right Eye Near:   Left Eye Near:     Bilateral Near:     Physical Exam Vitals reviewed.  Constitutional:      General: She is awake.     Appearance: Normal appearance. She is well-developed and well-groomed.  HENT:     Head: Normocephalic and atraumatic.  Eyes:     General: Lids are normal. Gaze aligned appropriately.     Extraocular Movements: Extraocular movements intact.     Conjunctiva/sclera: Conjunctivae normal.  Pulmonary:     Effort: Pulmonary effort is normal.  Musculoskeletal:     Right knee: Swelling, effusion and crepitus present. No deformity, erythema, ecchymosis or lacerations. Normal range of motion. Tenderness present over the lateral joint line and patellar tendon. No LCL laxity, MCL laxity, ACL laxity or PCL laxity.     Instability Tests: Anterior drawer test negative. Posterior drawer test negative. Medial McMurray test negative and lateral McMurray test negative.     Left knee: Normal.       Legs:     Comments: Patient does not have any notable laxity with LCL, MCL, ACL, PCL testing.  She has negative Apley grind test on the right  knee. She does report pain with extension of the knee and there is notable clicking and catching particularly along the lateral joint line.  With extension she reports pain along the lateral aspect of the patella radiating down into the inferior aspect of the kneecap.   Neurological:     General: No focal deficit present.     Mental Status: She is alert and oriented to person, place, and time.     GCS: GCS eye subscore is 4. GCS verbal subscore is 5. GCS motor subscore is 6.     Cranial Nerves: No cranial nerve deficit, dysarthria or facial asymmetry.  Psychiatric:        Attention and Perception: Attention and perception normal.        Mood and Affect: Mood and affect normal.        Speech: Speech normal.  Behavior: Behavior normal. Behavior is cooperative.     UC Treatments / Results  Labs (all labs ordered are listed, but only abnormal results are  displayed) Labs Reviewed - No data to display  EKG   Radiology No results found.  Procedures Procedures (including critical care time)  Medications Ordered in UC Medications - No data to display  Initial Impression / Assessment and Plan / UC Course  I have reviewed the triage vital signs and the nursing notes.  Pertinent labs & imaging results that were available during my care of the patient were reviewed by me and considered in my medical decision making (see chart for details).      Final Clinical Impressions(s) / UC Diagnoses   Final diagnoses:  Acute pain of right knee  Pain and swelling of right knee   Patient presents today with concerns for right knee pain has been ongoing for about 4 days.  She denies recent injury or trauma to the area.  She reports that she has been taking ibuprofen  but this did not provide much relief.  Physical exam is notable for catching and crepitus with right knee extension.  No appreciated laxity, erythema, instability.  Apley grind test is negative.  At this time I suspect likely patellar tendon etiology given location of pain and symptoms.  Will provide knee brace to assist with stability as well as prednisone taper to assist with inflammation.  Recommend continued use of Tylenol  as needed for symptomatic relief.  Recommend warm compresses, elevation, and rest as she is able.  Reviewed with patient that if symptoms or not improving she may need to follow-up with orthopedics for further evaluation and ongoing management as she may have chronic issue that requires physical therapy or further intervention.  ED and return precautions also reviewed and provided in after visit summary.  Follow-up as needed    Discharge Instructions      You are seen today for concerns of acute right knee pain.  At this time I suspect that you likely have inflammation of the patella, also known as the kneecap.  I think that you may have some inflammation around the  connective tissue of the kneecap that is likely causing your pain.  This typically responds well to conservative measures such as Tylenol  and ibuprofen , rest, warm compress to the area, elevation and gentle stretches.  We have provided you with a knee brace to help stabilize your knee and prevent weakness or giving out.  I have also sent in a prescription for a steroid to help reduce inflammation and hopefully help relieve some of your discomfort. You can take Tylenol  as needed to help with further pain management. As needed you can apply warm compresses to the area, 15 minutes on and at least 30 minutes off to prevent tissue damage. If at any point you start to develop more severe swelling, difficulty moving or bending the knee, severe pain, redness or skin changes please return here or go to the emergency room for further evaluation and management  If you feel like your symptoms are not improving you can follow-up with orthopedics for further evaluation and ongoing management. [facility] [address], [place], [place] [address] [phone]  [facility] [address], [place], [place] [address]  [phone]     ED Prescriptions     Medication Sig Dispense Auth. Provider   predniSONE (DELTASONE) 20 MG tablet Take 60mg  PO daily x 2 days, then40mg  PO daily x 2  days, then 20mg  PO daily x 3 days 13 tablet Chanita Boden E, PA-C      PDMP not reviewed this encounter.   Lakeya Mulka E, PA-C 02/01/24 2014

## 2024-02-01 NOTE — Discharge Instructions (Addendum)
 You are seen today for concerns of acute right knee pain.  At this time I suspect that you likely have inflammation of the patella, also known as the kneecap.  I think that you may have some inflammation around the connective tissue of the kneecap that is likely causing your pain.  This typically responds well to conservative measures such as Tylenol  and ibuprofen , rest, warm compress to the area, elevation and gentle stretches.  We have provided you with a knee brace to help stabilize your knee and prevent weakness or giving out.  I have also sent in a prescription for a steroid to help reduce inflammation and hopefully help relieve some of your discomfort. You can take Tylenol  as needed to help with further pain management. As needed you can apply warm compresses to the area, 15 minutes on and at least 30 minutes off to prevent tissue damage. If at any point you start to develop more severe swelling, difficulty moving or bending the knee, severe pain, redness or skin changes please return here or go to the emergency room for further evaluation and management  If you feel like your symptoms are not improving you can follow-up with orthopedics for further evaluation and ongoing management. [facility] [address] [place] [address], [address], [place], [place] [address] [phone]  [facility] [address] [place], [place] [address]  [phone]
